# Patient Record
Sex: Female | Born: 1975 | Race: White | Hispanic: No | Marital: Married | Smoking: Never smoker
Health system: Southern US, Community
[De-identification: ages and names within clinical notes are randomized; demographics above are authoritative.]

## PROBLEM LIST (undated history)

## (undated) DIAGNOSIS — F419 Anxiety disorder, unspecified: Secondary | ICD-10-CM

## (undated) DIAGNOSIS — R87619 Unspecified abnormal cytological findings in specimens from cervix uteri: Secondary | ICD-10-CM

## (undated) DIAGNOSIS — IMO0002 Reserved for concepts with insufficient information to code with codable children: Secondary | ICD-10-CM

## (undated) DIAGNOSIS — Z8742 Personal history of other diseases of the female genital tract: Secondary | ICD-10-CM

## (undated) DIAGNOSIS — T7840XA Allergy, unspecified, initial encounter: Secondary | ICD-10-CM

## (undated) HISTORY — DX: Allergy, unspecified, initial encounter: T78.40XA

## (undated) HISTORY — PX: OTHER SURGICAL HISTORY: SHX169

## (undated) HISTORY — DX: Anxiety disorder, unspecified: F41.9

## (undated) HISTORY — PX: TONSILLECTOMY: SUR1361

## (undated) HISTORY — DX: Personal history of other diseases of the female genital tract: Z87.42

---

## 2004-02-07 ENCOUNTER — Other Ambulatory Visit: Admission: RE | Admit: 2004-02-07 | Discharge: 2004-02-07 | Payer: Self-pay | Admitting: Family Medicine

## 2005-02-08 ENCOUNTER — Other Ambulatory Visit: Admission: RE | Admit: 2005-02-08 | Discharge: 2005-02-08 | Payer: Self-pay | Admitting: Obstetrics and Gynecology

## 2005-03-19 ENCOUNTER — Ambulatory Visit: Payer: Self-pay | Admitting: Sports Medicine

## 2006-06-17 ENCOUNTER — Inpatient Hospital Stay (HOSPITAL_COMMUNITY): Admission: AD | Admit: 2006-06-17 | Discharge: 2006-06-19 | Payer: Self-pay | Admitting: Obstetrics and Gynecology

## 2007-02-25 ENCOUNTER — Ambulatory Visit: Payer: Self-pay | Admitting: Sports Medicine

## 2007-02-25 DIAGNOSIS — M629 Disorder of muscle, unspecified: Secondary | ICD-10-CM

## 2007-10-01 ENCOUNTER — Encounter: Admission: RE | Admit: 2007-10-01 | Discharge: 2007-11-17 | Payer: Self-pay | Admitting: Family Medicine

## 2009-01-09 ENCOUNTER — Ambulatory Visit: Payer: Self-pay | Admitting: Sports Medicine

## 2009-04-29 HISTORY — PX: LEEP: SHX91

## 2009-09-14 ENCOUNTER — Ambulatory Visit: Payer: Self-pay | Admitting: Sports Medicine

## 2009-09-14 DIAGNOSIS — M25569 Pain in unspecified knee: Secondary | ICD-10-CM | POA: Insufficient documentation

## 2009-11-09 ENCOUNTER — Ambulatory Visit: Payer: Self-pay | Admitting: Sports Medicine

## 2010-01-31 ENCOUNTER — Ambulatory Visit: Admission: RE | Admit: 2010-01-31 | Discharge: 2010-01-31 | Payer: Self-pay | Admitting: Gynecologic Oncology

## 2010-04-26 ENCOUNTER — Ambulatory Visit
Admission: RE | Admit: 2010-04-26 | Discharge: 2010-04-26 | Payer: Self-pay | Source: Home / Self Care | Attending: Gynecologic Oncology | Admitting: Gynecologic Oncology

## 2010-04-26 ENCOUNTER — Other Ambulatory Visit
Admission: RE | Admit: 2010-04-26 | Discharge: 2010-04-26 | Payer: Self-pay | Source: Home / Self Care | Admitting: Gynecologic Oncology

## 2010-04-29 NOTE — L&D Delivery Note (Signed)
Delivery Note At 12:38 AM a viable female was delivered via Vaginal, Spontaneous Delivery   APGAR: 8, 9; weight . Placenta status: Intact, Spontaneous.  Cord:  with the following complications: None.  Cord pH: not done  Anesthesia: Epidural  Episiotomy:  Lacerations: 2nd degree Suture Repair: 3.0 chromic Est. Blood Loss (mL):   Mom to postpartum.  Baby to nursery-stable.  Issak Goley L 02/03/2011, 12:51 AM

## 2010-05-29 NOTE — Assessment & Plan Note (Signed)
Summary: F/U,MC   Vital Signs:  Patient profile:   35 year old female BP sitting:   102 / 68  Vitals Entered By: Lillia Pauls CMA (November 09, 2009 9:56 AM)  History of Present Illness: pt here today to f/u her right knee pain which is about 75% better per pt.  she currently has no pain or swelling.  she still wears the bodyhelix knee sleeve that helps her quite a bit while she exercises.  she is running 11.5 miles per week and currently runs 4 x wk.  desire is to run in a 1/2 marathon in [place]. of this year.   Physical Exam  General:  Well-developed,well-nourished,in no acute distress; alert,appropriate and cooperative throughout examination Msk:  knee exam shows no effusion; stable ligaments; negative Mcmurray's and provocative meniscal tests; non painful patellar compression; patellar and quadriceps tendons unremarkable.  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

## 2010-05-29 NOTE — Assessment & Plan Note (Signed)
Summary: F/U KNEE PAIN,MC   Vital Signs:  Patient profile:   35 year old female BP sitting:   116 / 73  Vitals Entered By: Lillia Pauls CMA (Sep 14, 2009 9:44 AM)  History of Present Illness: Kathryn Bennett took most of winter off but did do elliptical started at 4 x wk 3 to 4 miles after a couple of weeks had RT knee pain [place] also pushes daugher in stroller on weekends  past episodes of runners knee swelled some this time different - very painful down stairs hurt at [employer] and hurt around MM   Physical Exam  General:  Well-developed,well-nourished,in no acute distress; alert,appropriate and cooperative throughout examination Msk:  RT knee exam shows no effusion; stable ligaments; negative Mcmurray's and provocative meniscal tests; non painful patellar compression; patellar and quadriceps tendons unremarkable.  does have some slt crepitation only on RT knee  LT knee is normal  Extremities:  running gait is excellent uses sports insoles   Impression & Recommendations:  Problem # 1:  KNEE PAIN, RIGHT (ICD-719.46) seems like classic runners knee  see rehab program w std exercises  Patient Instructions: 1)  do quad exercises daily 2)  avoid squats - ok runner's squat 3)  ice after activities 4)  ok to start w running about 2 miles and then build half mile per wk if OK 5)  reck in 8 wks 6)  use the knee sleeve unless painful 7)  give me an email report every 2 wks about sleeve if it seems to be working well 8)  rtc 2months

## 2010-06-22 LAB — HEPATITIS B SURFACE ANTIGEN: Hepatitis B Surface Ag: NEGATIVE

## 2010-06-22 LAB — HIV ANTIBODY (ROUTINE TESTING W REFLEX): HIV: NONREACTIVE

## 2010-06-22 LAB — ANTIBODY SCREEN: Antibody Screen: NEGATIVE

## 2010-06-22 LAB — ABO/RH

## 2010-09-14 NOTE — Op Note (Signed)
NAME:  Kathryn Bennett, Kathryn Bennett               ACCOUNT NO.:  [id number]   MEDICAL RECORD NO.:  [id number]          PATIENT TYPE:  INP   LOCATION:  9170                          FACILITY:  [facility]   PHYSICIAN:  Juluis Mire, M.D.   DATE OF BIRTH:  Apr 23, 1976   DATE OF PROCEDURE:  06/17/2006  DATE OF DISCHARGE:                               OPERATIVE REPORT   DELIVERY NOTE:  The patient progressed to complete dilatation.  With  pushing, the infant's heart rate demonstrated repetitive decels down  into the 70s and 80s with slow recovery.  The vertex was presenting at  the perineum at this point in time, was in the LOA presentation.  It was  noted that she did have meconium stained fluid prior, although it was  felt to be thin, no amnio-infusion was performed.  Due to repetitive  decels, the decision was to expedite delivery with the use of a vacuum  extractor.  The risks were discussed including the possible risk of  subcutaneous bleed versus intracranial bleeds.  The patient had a Foley  catheter in place that was removed.  She was placed in the dorsal  lithotomy position.  She was prepped and draped.  The Kiwi vacuum  extractor was put in place, the next contraction brought it to the green  level, and the vertex was brought down further to the perineum. The Kiwi  slipped at this point in time.  She underwent continued expulsive  efforts with further descent, but the fetal heart rates dropped in the  70s and persisted.  The Kiwi was reapplied, with the next contraction,  the vertex was delivered.  The oropharynx and nasopharynx were suctioned  with DeLee and bulb.  The infant was delivered and passed to the  awaiting pediatric team.  The infant was a live female, Apgars were 8/9,  pH is pending.  There was no evidence of any complications.  No nuchal  cord was noted.  The placenta was delivered intact with three vessel  cord.  There was no episiotomy, but she had a second degree midline  tear.  It  was repaired with 2-0 chromic under local.  Again, the  placenta was delivered intact.  Bleeding was minimal with a total blood  loss of 500 mL.  The patient did have an epidural in place.  Both mother  and baby did well in the postpartum period.      Juluis Mire, M.D.  Electronically Signed     JSM/MEDQ  D:  06/17/2006  T:  06/17/2006  Job:  [id number]

## 2010-10-24 ENCOUNTER — Encounter: Payer: Self-pay | Admitting: Family Medicine

## 2010-10-24 ENCOUNTER — Ambulatory Visit (INDEPENDENT_AMBULATORY_CARE_PROVIDER_SITE_OTHER): Payer: Self-pay | Admitting: Family Medicine

## 2010-10-24 VITALS — BP 106/63 | HR 87

## 2010-10-24 DIAGNOSIS — M949 Disorder of cartilage, unspecified: Secondary | ICD-10-CM

## 2010-10-24 DIAGNOSIS — M899 Disorder of bone, unspecified: Secondary | ICD-10-CM

## 2010-10-24 NOTE — Progress Notes (Signed)
  Subjective:    Patient ID: Kathryn Bennett, female    DOB: 06/16/1975, 35 y.o.   MRN: [id number]  HPI 35 year old female who is a G2 P1 and presently at 26 weeks of pregnancy here for evaluation of right shoulder blade pain. 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 She states for several years she's had intermittent medial right shoulder blade pain that is kind of a dull ache with occasional throbbing. For the last 5 days, the pain has gotten worse and is turned into more of a burning sensation. She states she's done physical therapy in the past, this included ultrasound and some deep massage. In the past, she has done pred burst and is unsure if it helped.  She has never really done significant rehabilitation exercises for this. She denies any lateral shoulder pain or radiculopathy into her hands. She denies any neck pain. She states the pregnancy is going well. She's not had any recent vaginal bleeding, abdominal pain, or significant urticaria. Avid running, running 12 miles per week.  Also does yoga 1 x/week, but hasn't recently because of time constraints. Really has not taken much in the way of medicines, and she would like to stay away from them during her pregnancy.   Review of Systems Denies fever, sweats, chills, weight loss, vaginal bleeding, urticaria, abdominal pain. She is tolerating by mouth normally.    Objective:   Physical Exam Gen. appearance: Well-appearing gravid female in no distress Neck: Supple, full range of motion, negative Spurling bilaterally Right shoulder: Full range of motion. Normal rotator cuff strength. Negative impingement signs. Upper back: Positive tenderness with some muscle spasm just medial to the right scapula medial border. Actually has fairly good scapular motion with no significant dyskinesis. No significant increase in pain with protraction or retraction or shoulder shrug. Neuro: Normal upper extremity strength. Sensation intact to light  touch.       Assessment & Plan:  Right scapular pain. Likely from long-term irritation and dysfunction of the middle and lower trapezius muscles as well as possibly the rhomboids. - Given she is pregnant, we'll do Tylenol as needed if the patient desires -Reviewed scapular dysfunction exercises with her. These are to include the robbery, lawnmower, the inferior glide, rows, reverse rows, and horizontal abduction exercises. - Can ice and or heat as desired - She will also try to get back into doing yoga to see if this will help as well - I did spent time explaining how pregnancy with increased weight in the belly and breast region can increase stress to scapular stabilizing muscles. -I think she can followup as needed, preferably after her pregnancy to see if this improves on its own. If it does not, or gets worse, or she has new or changing symptoms, at at some point could consider a trigger point injection versus an MRI of the neck to rule out C-spine pathology, however think this is fairly low probability.

## 2011-01-07 LAB — STREP B DNA PROBE: GBS: NEGATIVE

## 2011-02-02 ENCOUNTER — Encounter (HOSPITAL_COMMUNITY): Payer: Self-pay

## 2011-02-02 ENCOUNTER — Inpatient Hospital Stay (HOSPITAL_COMMUNITY): Payer: 59 | Admitting: Anesthesiology

## 2011-02-02 ENCOUNTER — Encounter (HOSPITAL_COMMUNITY): Payer: Self-pay | Admitting: Anesthesiology

## 2011-02-02 ENCOUNTER — Inpatient Hospital Stay (HOSPITAL_COMMUNITY)
Admission: AD | Admit: 2011-02-02 | Discharge: 2011-02-04 | DRG: 775 | Disposition: A | Discharge status: Home / Self Care | Payer: 59 | Source: Ambulatory Visit | Attending: Obstetrics and Gynecology | Admitting: Obstetrics and Gynecology

## 2011-02-02 DIAGNOSIS — M629 Disorder of muscle, unspecified: Secondary | ICD-10-CM

## 2011-02-02 DIAGNOSIS — M899 Disorder of bone, unspecified: Secondary | ICD-10-CM

## 2011-02-02 DIAGNOSIS — M25569 Pain in unspecified knee: Secondary | ICD-10-CM

## 2011-02-02 HISTORY — DX: Unspecified abnormal cytological findings in specimens from cervix uteri: R87.619

## 2011-02-02 HISTORY — DX: Reserved for concepts with insufficient information to code with codable children: IMO0002

## 2011-02-02 LAB — RPR: RPR Ser Ql: NONREACTIVE

## 2011-02-02 LAB — RUBELLA SCREEN: Rubella: 42.6 IU/mL — ABNORMAL HIGH

## 2011-02-02 LAB — CBC
MCH: 30.7 pg (ref 26.0–34.0)
MCV: 89.6 fL (ref 78.0–100.0)
Platelets: 201 10*3/uL (ref 150–400)
RDW: 14 % (ref 11.5–15.5)
WBC: 14.7 10*3/uL — ABNORMAL HIGH (ref 4.0–10.5)

## 2011-02-02 MED ORDER — ACETAMINOPHEN 325 MG PO TABS: 650.0000 mg | ORAL_TABLET | ORAL | Status: DC | PRN

## 2011-02-02 MED ORDER — LACTATED RINGERS IV SOLN: 500.0000 mL | INTRAVENOUS | Status: DC | PRN

## 2011-02-02 MED ORDER — CITRIC ACID-SODIUM CITRATE 334-500 MG/5ML PO SOLN
30.0000 mL | ORAL | Status: DC | PRN
  Administered 2011-02-02: 30 mL via ORAL
  Filled 2011-02-02: qty 15

## 2011-02-02 MED ORDER — OXYTOCIN 20 UNITS IN LACTATED RINGERS INFUSION - SIMPLE: 125.0000 mL/h | Freq: Once | INTRAVENOUS | Status: DC

## 2011-02-02 MED ORDER — LIDOCAINE HCL (PF) 1 % IJ SOLN
30.0000 mL | INTRAMUSCULAR | Status: DC | PRN
  Filled 2011-02-02 (×2): qty 30

## 2011-02-02 MED ORDER — LIDOCAINE HCL 1.5 % IJ SOLN
INTRAMUSCULAR | Status: DC | PRN
  Administered 2011-02-02 (×2): 5 mL via EPIDURAL

## 2011-02-02 MED ORDER — LACTATED RINGERS IV SOLN
INTRAVENOUS | Status: DC
  Administered 2011-02-02 (×2): via INTRAVENOUS

## 2011-02-02 MED ORDER — FLEET ENEMA 7-19 GM/118ML RE ENEM: 1.0000 | ENEMA | RECTAL | Status: DC | PRN

## 2011-02-02 MED ORDER — OXYCODONE-ACETAMINOPHEN 5-325 MG PO TABS: 2.0000 | ORAL_TABLET | ORAL | Status: DC | PRN

## 2011-02-02 MED ORDER — EPHEDRINE 5 MG/ML INJ
10.0000 mg | INTRAVENOUS | Status: AC | PRN
  Administered 2011-02-02 (×2): 10 mg via INTRAVENOUS
  Filled 2011-02-02: qty 4

## 2011-02-02 MED ORDER — TERBUTALINE SULFATE 1 MG/ML IJ SOLN: 0.2500 mg | Freq: Once | INTRAMUSCULAR | Status: AC | PRN

## 2011-02-02 MED ORDER — OXYTOCIN BOLUS FROM INFUSION
500.0000 mL | Freq: Once | INTRAVENOUS | Status: DC
  Filled 2011-02-02: qty 500

## 2011-02-02 MED ORDER — FENTANYL 2.5 MCG/ML BUPIVACAINE 1/10 % EPIDURAL INFUSION (WH - ANES)
INTRAMUSCULAR | Status: DC | PRN
  Administered 2011-02-02: 14 mL/h via EPIDURAL

## 2011-02-02 MED ORDER — IBUPROFEN 600 MG PO TABS
600.0000 mg | ORAL_TABLET | Freq: Four times a day (QID) | ORAL | Status: DC | PRN
  Filled 2011-02-02 (×4): qty 1

## 2011-02-02 MED ORDER — EPHEDRINE 5 MG/ML INJ
10.0000 mg | INTRAVENOUS | Status: DC | PRN
  Filled 2011-02-02: qty 4

## 2011-02-02 MED ORDER — DIPHENHYDRAMINE HCL 50 MG/ML IJ SOLN: 12.5000 mg | INTRAMUSCULAR | Status: DC | PRN

## 2011-02-02 MED ORDER — LACTATED RINGERS IV SOLN: 500.0000 mL | Freq: Once | INTRAVENOUS | Status: DC

## 2011-02-02 MED ORDER — ONDANSETRON HCL 4 MG/2ML IJ SOLN: 4.0000 mg | Freq: Four times a day (QID) | INTRAMUSCULAR | Status: DC | PRN

## 2011-02-02 MED ORDER — OXYTOCIN 20 UNITS IN LACTATED RINGERS INFUSION - SIMPLE
1.0000 m[IU]/min | INTRAVENOUS | Status: DC
  Administered 2011-02-02: 2 m[IU]/min via INTRAVENOUS
  Administered 2011-02-03: 999 m[IU]/min via INTRAVENOUS
  Filled 2011-02-02: qty 1000

## 2011-02-02 MED ORDER — PHENYLEPHRINE 40 MCG/ML (10ML) SYRINGE FOR IV PUSH (FOR BLOOD PRESSURE SUPPORT)
80.0000 ug | PREFILLED_SYRINGE | INTRAVENOUS | Status: DC | PRN
  Filled 2011-02-02 (×2): qty 5

## 2011-02-02 MED ORDER — FENTANYL 2.5 MCG/ML BUPIVACAINE 1/10 % EPIDURAL INFUSION (WH - ANES)
14.0000 mL/h | INTRAMUSCULAR | Status: DC
  Administered 2011-02-02: 14 mL/h via EPIDURAL
  Filled 2011-02-02 (×2): qty 60

## 2011-02-02 MED ORDER — PHENYLEPHRINE 40 MCG/ML (10ML) SYRINGE FOR IV PUSH (FOR BLOOD PRESSURE SUPPORT)
80.0000 ug | PREFILLED_SYRINGE | INTRAVENOUS | Status: DC | PRN
  Filled 2011-02-02: qty 5

## 2011-02-02 NOTE — H&P (Signed)
35 year ol G 2 P 1001 at 40 w 2 d presents in active labor. BOW I. PNC See [facility]. History of LEEP in 11/2009. History of NSVD x 1 GBBS is negative  Afebrile VSS FHR is reactive  Toco UCS every 4 to 5 min Gen alert and oriented Lung CTAB Car RRR Abd gravid Leopolds 8 pound Cervix per L and D nurse 4 cm dilated / vertex   IMP: IUP at 40 w 2 day Labor  PLAN: Epidural  Then AROM

## 2011-02-02 NOTE — Progress Notes (Signed)
Patient is status post epidural FHR is reassurring Toco Ucs every 2 to 4  Cervix 90/7 / - 2  OP position IUPC placed  No change since exam at 630 pm  IUP at term  Given slow progress of labor We will monitor Montivideo Units now and adjust pitocin accordingly.  Discussed with patient and her husband that if progress does not occur we may need to discuss Cesarean Section All Questions are answered.

## 2011-02-02 NOTE — Anesthesia Preprocedure Evaluation (Signed)
Anesthesia Evaluation  Name, MR# and DOB Patient awake  General Assessment Comment  Reviewed: Allergy & Precautions, H&P , Patient's Chart, lab work & pertinent test results  Airway Mallampati: I TM Distance: >3 FB Neck ROM: full    Dental No notable dental hx.    Pulmonary    Pulmonary exam normal       Cardiovascular     Neuro/Psych Negative Neurological ROS  Negative Psych ROS   GI/Hepatic negative GI ROS Neg liver ROS    Endo/Other  Negative Endocrine ROS  Renal/GU negative Renal ROS     Musculoskeletal negative musculoskeletal ROS (+)   Abdominal Normal abdominal exam  (+)   Peds  Hematology negative hematology ROS (+)   Anesthesia Other Findings   Reproductive/Obstetrics (+) Pregnancy                           Anesthesia Physical Anesthesia Plan  ASA: II  Anesthesia Plan: Epidural   Post-op Pain Management:    Induction:   Airway Management Planned:   Additional Equipment:   Intra-op Plan:   Post-operative Plan:   Informed Consent: I have reviewed the patients History and Physical, chart, labs and discussed the procedure including the risks, benefits and alternatives for the proposed anesthesia with the patient or authorized representative who has indicated his/her understanding and acceptance.     Plan Discussed with:   Anesthesia Plan Comments:         Anesthesia Quick Evaluation

## 2011-02-02 NOTE — Anesthesia Procedure Notes (Signed)
Epidural Patient location during procedure: OB Start time: 02/02/2011 6:11 PM End time: 02/02/2011 6:15 PM  Staffing Anesthesiologist: Sandrea Hughs Performed by: anesthesiologist   Preanesthetic Checklist Completed: patient identified, site marked, surgical consent, pre-op evaluation, timeout performed, IV checked, risks and benefits discussed and monitors and equipment checked  Epidural Patient position: sitting Prep: site prepped and draped and DuraPrep Patient monitoring: continuous pulse ox and blood pressure Approach: midline Injection technique: LOR air  Needle:  Needle type: Tuohy  Needle gauge: 17 G Needle length: 9 cm Needle insertion depth: 5 cm cm Catheter type: closed end flexible Catheter size: 19 Gauge Catheter at skin depth: 10 cm Test dose: negative and 1.5% lidocaine  Assessment Sensory level: T8 Events: blood not aspirated, injection not painful, no injection resistance, negative IV test and no paresthesia

## 2011-02-02 NOTE — Progress Notes (Signed)
Onset of contractions since 0800 this morning every 8 minutes, bleeding on tissue when wipe, pressure.

## 2011-02-03 ENCOUNTER — Encounter (HOSPITAL_COMMUNITY): Payer: Self-pay | Admitting: *Deleted

## 2011-02-03 LAB — CBC
MCH: 30.3 pg (ref 26.0–34.0)
Platelets: 187 10*3/uL (ref 150–400)
RBC: 3.8 MIL/uL — ABNORMAL LOW (ref 3.87–5.11)
WBC: 20 10*3/uL — ABNORMAL HIGH (ref 4.0–10.5)

## 2011-02-03 MED ORDER — DIBUCAINE 1 % RE OINT: 1.0000 "application " | TOPICAL_OINTMENT | RECTAL | Status: DC | PRN

## 2011-02-03 MED ORDER — FAMOTIDINE 20 MG PO TABS
20.0000 mg | ORAL_TABLET | Freq: Every day | ORAL | Status: DC
  Administered 2011-02-03 – 2011-02-04 (×2): 20 mg via ORAL
  Filled 2011-02-03 (×2): qty 1

## 2011-02-03 MED ORDER — WITCH HAZEL-GLYCERIN EX PADS
1.0000 "application " | MEDICATED_PAD | CUTANEOUS | Status: DC | PRN
  Administered 2011-02-03: 1 via TOPICAL

## 2011-02-03 MED ORDER — BISACODYL 10 MG RE SUPP: 10.0000 mg | Freq: Every day | RECTAL | Status: DC | PRN

## 2011-02-03 MED ORDER — SIMETHICONE 80 MG PO CHEW: 80.0000 mg | CHEWABLE_TABLET | ORAL | Status: DC | PRN

## 2011-02-03 MED ORDER — PRENATAL PLUS 27-1 MG PO TABS
1.0000 | ORAL_TABLET | Freq: Every day | ORAL | Status: DC
  Administered 2011-02-03 – 2011-02-04 (×2): 1 via ORAL
  Filled 2011-02-03 (×2): qty 1

## 2011-02-03 MED ORDER — BENZOCAINE-MENTHOL 20-0.5 % EX AERO
INHALATION_SPRAY | CUTANEOUS | Status: AC
  Administered 2011-02-03: 1 via TOPICAL
  Filled 2011-02-03: qty 56

## 2011-02-03 MED ORDER — DIPHENHYDRAMINE HCL 25 MG PO CAPS: 25.0000 mg | ORAL_CAPSULE | Freq: Four times a day (QID) | ORAL | Status: DC | PRN

## 2011-02-03 MED ORDER — TETANUS-DIPHTH-ACELL PERTUSSIS 5-2.5-18.5 LF-MCG/0.5 IM SUSP: 0.5000 mL | Freq: Once | INTRAMUSCULAR | Status: DC

## 2011-02-03 MED ORDER — SENNOSIDES-DOCUSATE SODIUM 8.6-50 MG PO TABS
2.0000 | ORAL_TABLET | Freq: Every day | ORAL | Status: DC
  Administered 2011-02-03: 2 via ORAL

## 2011-02-03 MED ORDER — MEASLES, MUMPS & RUBELLA VAC ~~LOC~~ INJ: 0.5000 mL | INJECTION | Freq: Once | SUBCUTANEOUS | Status: DC

## 2011-02-03 MED ORDER — ONDANSETRON HCL 4 MG/2ML IJ SOLN: 4.0000 mg | INTRAMUSCULAR | Status: DC | PRN

## 2011-02-03 MED ORDER — IBUPROFEN 600 MG PO TABS
600.0000 mg | ORAL_TABLET | Freq: Four times a day (QID) | ORAL | Status: DC
  Administered 2011-02-03 – 2011-02-04 (×6): 600 mg via ORAL
  Filled 2011-02-03 (×2): qty 1

## 2011-02-03 MED ORDER — OXYCODONE-ACETAMINOPHEN 5-325 MG PO TABS: 1.0000 | ORAL_TABLET | ORAL | Status: DC | PRN

## 2011-02-03 MED ORDER — MEDROXYPROGESTERONE ACETATE 150 MG/ML IM SUSP: 150.0000 mg | INTRAMUSCULAR | Status: DC | PRN

## 2011-02-03 MED ORDER — ONDANSETRON HCL 4 MG PO TABS: 4.0000 mg | ORAL_TABLET | ORAL | Status: DC | PRN

## 2011-02-03 MED ORDER — ZOLPIDEM TARTRATE 5 MG PO TABS: 5.0000 mg | ORAL_TABLET | Freq: Every evening | ORAL | Status: DC | PRN

## 2011-02-03 MED ORDER — FLEET ENEMA 7-19 GM/118ML RE ENEM: 1.0000 | ENEMA | RECTAL | Status: DC | PRN

## 2011-02-03 MED ORDER — BENZOCAINE-MENTHOL 20-0.5 % EX AERO
1.0000 "application " | INHALATION_SPRAY | CUTANEOUS | Status: DC | PRN
  Administered 2011-02-03: 1 via TOPICAL

## 2011-02-03 MED ORDER — LANOLIN HYDROUS EX OINT: TOPICAL_OINTMENT | CUTANEOUS | Status: DC | PRN

## 2011-02-03 NOTE — Anesthesia Postprocedure Evaluation (Signed)
  Anesthesia Post-op Note  Patient: Kathryn Bennett  Procedure(s) Performed: * No procedures listed *  Patient Location: Mother/Baby  Anesthesia Type: Epidural  Level of Consciousness: awake, alert  and oriented  Airway and Oxygen Therapy: Patient Spontanous Breathing  Post-op Pain: none  Post-op Assessment: Patient's Cardiovascular Status Stable, Respiratory Function Stable, Patent Airway, No signs of Nausea or vomiting, Adequate PO intake and Pain level controlled  Post-op Vital Signs: stable  Complications: No apparent anesthesia complications

## 2011-02-03 NOTE — Progress Notes (Signed)
Post Partum Day 0 Subjective: no complaints, up ad lib, voiding and tolerating PO  Objective: Blood pressure 91/53, pulse 75, temperature 98.4 F (36.9 C), temperature source Axillary, resp. rate 18, height 5\' 3"  (1.6 m), weight 80.015 kg (176 lb 6.4 oz), SpO2 99.00%, unknown if currently breastfeeding.  Physical Exam:  General: alert Lochia: appropriate Uterine Fundus: firm Incision: not applicable  DVT Evaluation: No evidence of DVT seen on physical exam.   Basename 02/03/11 0510 02/02/11 1700  HGB 11.5* 13.6  HCT 34.4* 39.7    Assessment/Plan: Discharge home and Breastfeeding   LOS: 1 day   Kathryn Bennett 02/03/2011, 7:48 AM

## 2011-02-03 NOTE — Anesthesia Postprocedure Evaluation (Signed)
Anesthesia Post Note  Patient: Kathryn Bennett  Procedure(s) Performed: * No procedures listed *  Anesthesia type: Epidural  Patient location: Mother/Baby  Post pain: Pain level controlled  Post assessment: Post-op Vital signs reviewed  Last Vitals:  Filed Vitals:   02/03/11 0345  BP: 111/64  Pulse: 110  Temp: 98.4 F (36.9 C)  Resp: 18    Post vital signs: Reviewed  Level of consciousness: awake  Complications: No apparent anesthesia complications

## 2011-02-04 MED ORDER — IBUPROFEN 600 MG PO TABS: 600.0000 mg | ORAL_TABLET | Freq: Four times a day (QID) | ORAL | Status: AC | PRN

## 2011-02-04 NOTE — Progress Notes (Signed)
Post Partum Day 1 Subjective: no complaints, up ad lib, voiding and desires early discharge  Objective: Blood pressure 98/60, pulse 65, temperature 97.5 F (36.4 C), temperature source Axillary, resp. rate 18, height 5\' 3"  (1.6 m), weight 80.015 kg (176 lb 6.4 oz), SpO2 99.00%, unknown if currently breastfeeding.  Physical Exam:  General: alert and cooperative Lochia: appropriate Uterine Fundus: firm Perineum intact DVT Evaluation: No evidence of DVT seen on physical exam.   Basename 02/03/11 0510 02/02/11 1700  HGB 11.5* 13.6  HCT 34.4* 39.7    Assessment/Plan: Discharge home   LOS: 2 days   Kathryn Bennett 02/04/2011, 8:42 AM

## 2011-02-04 NOTE — Discharge Summary (Signed)
Obstetric Discharge Summary Reason for Admission: onset of labor Prenatal Procedures: none Intrapartum Procedures: spontaneous vaginal delivery Postpartum Procedures: none Complications-Operative and Postpartum: 2 degree perineal laceration Hemoglobin  Date Value Range Status  02/03/2011 11.5* 12.0-15.0 (g/dL) Final     HCT  Date Value Range Status  02/03/2011 34.4* 36.0-46.0 (%) Final    Discharge Diagnoses: Term Pregnancy-delivered  Discharge Information: Date: 02/04/2011 Activity: pelvic rest Diet: routine Medications: None and Ibuprofen Condition: stable Instructions: refer to practice specific booklet Discharge to: home   Newborn Data: Live born female  Birth Weight: 9 lb 11.6 oz (4410 g) APGAR: 8, 9  Home with mother.  Kathryn Bennett G 02/04/2011, 8:51 AM

## 2011-02-07 ENCOUNTER — Inpatient Hospital Stay (HOSPITAL_COMMUNITY): Admission: RE | Admit: 2011-02-07 | Payer: 59 | Source: Ambulatory Visit

## 2011-02-27 ENCOUNTER — Ambulatory Visit (INDEPENDENT_AMBULATORY_CARE_PROVIDER_SITE_OTHER): Payer: 59 | Admitting: Family Medicine

## 2011-02-27 VITALS — BP 96/70 | Ht 63.0 in | Wt 152.0 lb

## 2011-02-27 DIAGNOSIS — M79673 Pain in unspecified foot: Secondary | ICD-10-CM

## 2011-02-27 DIAGNOSIS — M533 Sacrococcygeal disorders, not elsewhere classified: Secondary | ICD-10-CM

## 2011-02-27 DIAGNOSIS — M999 Biomechanical lesion, unspecified: Secondary | ICD-10-CM

## 2011-02-27 DIAGNOSIS — M25569 Pain in unspecified knee: Secondary | ICD-10-CM

## 2011-02-27 DIAGNOSIS — M79609 Pain in unspecified limb: Secondary | ICD-10-CM

## 2011-02-27 DIAGNOSIS — M25561 Pain in right knee: Secondary | ICD-10-CM

## 2011-02-27 NOTE — Patient Instructions (Signed)
1. Follow up with me in one month.  2. Physical therapy should call you for your appointment.  The phone number is [phone] in case you don't get a call.  3. Wear your insoles in all of your shoes.

## 2011-03-01 DIAGNOSIS — M79673 Pain in unspecified foot: Secondary | ICD-10-CM | POA: Insufficient documentation

## 2011-03-01 DIAGNOSIS — M533 Sacrococcygeal disorders, not elsewhere classified: Secondary | ICD-10-CM | POA: Insufficient documentation

## 2011-03-01 DIAGNOSIS — M25562 Pain in left knee: Secondary | ICD-10-CM | POA: Insufficient documentation

## 2011-03-01 NOTE — Assessment & Plan Note (Signed)
Plan on 2 visits of formal physical therapy to work on quad and abductor strength.  After she learns the exercises she will advance to a home program.

## 2011-03-01 NOTE — Assessment & Plan Note (Signed)
She is 3 weeks s/p her delivery.  Expect that this condition would resolve spontaneously.  Since the patient is very uncomfortable we will send her for formal physical therapy x2 visits with advance to home program.

## 2011-03-01 NOTE — Progress Notes (Signed)
  Subjective:    Patient ID: Kathryn Bennett, female    DOB: 09/26/1975, 35 y.o.   MRN: [id number]  HPI 35 y/o female is c/o right sided lower back pain, bilateral forefoot numbness, and bilateral knee pain which started during her last trimester.  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  Regarding her feet, she did have swelling but that has improved.  She has no pain and there is no trauma.  Her back pain is mostly noticed when she tries to lie down.  There is no radiation of the pain down the buttocks or legs.  She has no numbness or weakness of the legs.   Review of Systems     Objective:   Physical Exam Back: Flexes and extends without an increase in her pain Tender to palpation over the right SI, otherwise non-tender FABER reproduces the pain Negative straight leg DTR's 2+ Great toe strength 5/5  Knees: Minimal crepitus Tender to palpation on the lateral patella bilat Positive patellar grind bilat No effusions ROM 0-135 No laxity No joint line tenderness quad and hip abductor weakness bilat  Feet: Collapse of transverse arches bilat Callus in the mid forefoot bilat Longitudinal arch preserved Sensation is normal except at the callus where it is decreased      Assessment & Plan:

## 2011-03-01 NOTE — Assessment & Plan Note (Signed)
She was fitted with sports insoles with metatarsal pads which should decreased the pressure over the heads of the metatarsals.

## 2011-03-19 ENCOUNTER — Ambulatory Visit: Payer: 59 | Attending: Family Medicine | Admitting: Physical Therapy

## 2011-03-19 DIAGNOSIS — IMO0001 Reserved for inherently not codable concepts without codable children: Secondary | ICD-10-CM | POA: Insufficient documentation

## 2011-03-19 DIAGNOSIS — M25559 Pain in unspecified hip: Secondary | ICD-10-CM | POA: Insufficient documentation

## 2011-03-19 DIAGNOSIS — M25659 Stiffness of unspecified hip, not elsewhere classified: Secondary | ICD-10-CM | POA: Insufficient documentation

## 2011-03-28 ENCOUNTER — Ambulatory Visit: Payer: 59 | Admitting: Physical Therapy

## 2011-04-04 ENCOUNTER — Encounter: Payer: 59 | Admitting: Physical Therapy

## 2011-04-11 ENCOUNTER — Ambulatory Visit: Payer: 59 | Attending: Family Medicine | Admitting: Physical Therapy

## 2011-04-11 DIAGNOSIS — IMO0001 Reserved for inherently not codable concepts without codable children: Secondary | ICD-10-CM | POA: Insufficient documentation

## 2011-04-11 DIAGNOSIS — M25659 Stiffness of unspecified hip, not elsewhere classified: Secondary | ICD-10-CM | POA: Insufficient documentation

## 2011-04-11 DIAGNOSIS — M25559 Pain in unspecified hip: Secondary | ICD-10-CM | POA: Insufficient documentation

## 2011-04-18 ENCOUNTER — Ambulatory Visit: Payer: 59 | Admitting: Physical Therapy

## 2011-04-25 ENCOUNTER — Ambulatory Visit: Payer: 59 | Admitting: Physical Therapy

## 2011-04-29 ENCOUNTER — Encounter: Payer: 59 | Admitting: Physical Therapy

## 2012-06-13 ENCOUNTER — Other Ambulatory Visit: Payer: Self-pay

## 2012-11-04 ENCOUNTER — Other Ambulatory Visit: Payer: Self-pay | Admitting: Obstetrics and Gynecology

## 2013-03-04 ENCOUNTER — Other Ambulatory Visit: Payer: Self-pay

## 2013-03-18 ENCOUNTER — Encounter (HOSPITAL_COMMUNITY): Payer: Self-pay | Admitting: Emergency Medicine

## 2013-03-18 ENCOUNTER — Emergency Department (INDEPENDENT_AMBULATORY_CARE_PROVIDER_SITE_OTHER)
Admission: EM | Admit: 2013-03-18 | Discharge: 2013-03-18 | Disposition: A | Discharge status: Home / Self Care | Payer: 59 | Source: Home / Self Care | Attending: Family Medicine | Admitting: Family Medicine

## 2013-03-18 DIAGNOSIS — R059 Cough, unspecified: Secondary | ICD-10-CM

## 2013-03-18 DIAGNOSIS — R0982 Postnasal drip: Secondary | ICD-10-CM

## 2013-03-18 DIAGNOSIS — R05 Cough: Secondary | ICD-10-CM

## 2013-03-18 MED ORDER — GUAIFENESIN-CODEINE 100-10 MG/5ML PO SYRP: 5.0000 mL | ORAL_SOLUTION | ORAL | Status: DC | PRN

## 2013-03-18 MED ORDER — PHENYLEPHRINE-CHLORPHEN-DM 10-4-12.5 MG/5ML PO LIQD: ORAL | Status: DC

## 2013-03-18 NOTE — ED Provider Notes (Signed)
CSN: [id number]     Arrival date & time 03/18/13  1217 History   First MD Initiated Contact with Patient 03/18/13 1307     No chief complaint on file.  (Consider location/radiation/quality/duration/timing/severity/associated sxs/prior Treatment) HPI Comments: C/O cough and PND that keeps her awake at night. Tried sudafed, netty pot, but no antihistamines or cough meds.   Past Medical History  Diagnosis Date  . Abnormal Pap smear    Past Surgical History  Procedure Laterality Date  . Tonsillectomy    . Leep  2011  . Extraction of wisdom teeth      age 37   No family history on file. History  Substance Use Topics  . Smoking status: Never Smoker   . Smokeless tobacco: Never Used  . Alcohol Use: No   OB History   Grav Para Term Preterm Abortions TAB SAB Ect Mult Living   2 2 2       2      Review of Systems  Constitutional: Negative for fever, chills, activity change, appetite change and fatigue.  HENT: Positive for postnasal drip and rhinorrhea. Negative for congestion, facial swelling and sore throat.   Eyes: Negative.   Respiratory: Positive for cough. Negative for shortness of breath, wheezing and stridor.   Cardiovascular: Negative.   Gastrointestinal: Negative.   Genitourinary: Negative.   Musculoskeletal: Negative for neck pain and neck stiffness.  Skin: Negative for pallor and rash.  Neurological: Negative.     Allergies  Amoxicillin  Home Medications   Current Outpatient Rx  Name  Route  Sig  Dispense  Refill  . docusate sodium (COLACE) 100 MG capsule   Oral   Take 100 mg by mouth 3 (three) times daily.           . fish oil-omega-3 fatty acids 1000 MG capsule   Oral   Take 2 g by mouth daily.           Marland Kitchen guaiFENesin-codeine (ROBITUSSIN AC) 100-10 MG/5ML syrup   Oral   Take 5 mLs by mouth every 4 (four) hours as needed for cough.   120 mL   0   . Phenylephrine-Chlorphen-DM 01-31-11.5 MG/5ML LIQD      5 ml q 4h during the night prn severe  cough and drainage.   120 mL   0   . prenatal vitamin w/FE, FA (PRENATAL 1 + 1) 27-1 MG TABS   Oral   Take 1 tablet by mouth daily.            BP 137/79  Pulse 96  Temp(Src) 98.7 F (37.1 C) (Oral)  Resp 20  SpO2 99% Physical Exam  Nursing note and vitals reviewed. Constitutional: She is oriented to person, place, and time. She appears well-developed and well-nourished. No distress.  HENT:  Right Ear: External ear normal.  Left Ear: External ear normal.  Mouth/Throat: No oropharyngeal exudate.  Minor injection. No erythema, swelling or exudates. Clear PND  Eyes: Conjunctivae and EOM are normal.  Neck: Normal range of motion. Neck supple.  Cardiovascular: Normal rate, regular rhythm and normal heart sounds.   Pulmonary/Chest: Effort normal and breath sounds normal. No respiratory distress. She has no wheezes. She has no rales.  Musculoskeletal: Normal range of motion. She exhibits no edema.  Lymphadenopathy:    She has no cervical adenopathy.  Neurological: She is alert and oriented to person, place, and time.  Skin: Skin is warm and dry. No rash noted.  Psychiatric: She has a normal mood  and affect.    ED Course  Procedures (including critical care time) Labs Review Labs Reviewed - No data to display Imaging Review No results found.    MDM   1. Cough   2. PND (post-nasal drip)      Norel CS as directed at night prn cough ad drainage. Cheratussin with Cod q 4h prn other times Allegra 180 mg q d for drainage. Plenty of fluids.  Hayden Rasmussen, NP 03/18/13 1418

## 2013-03-18 NOTE — ED Notes (Signed)
Pt called in all waiting areas and no response.

## 2013-03-18 NOTE — ED Notes (Signed)
Cough onset one week ago, seen by Hayden Rasmussen, np

## 2013-03-20 NOTE — ED Provider Notes (Signed)
Medical screening examination/treatment/procedure(s) were performed by a resident physician or non-physician practitioner and as the supervising physician I was immediately available for consultation/collaboration.  Geoffrey Hynes, MD    Cherese Lozano S Cherrell Maybee, MD 03/20/13 0839 

## 2014-01-18 ENCOUNTER — Other Ambulatory Visit: Payer: Self-pay | Admitting: Obstetrics and Gynecology

## 2014-01-19 LAB — CYTOLOGY - PAP

## 2014-02-28 ENCOUNTER — Encounter (HOSPITAL_COMMUNITY): Payer: Self-pay | Admitting: Emergency Medicine

## 2015-06-28 MED FILL — BUPROPION HCL XL 300 MG TAB: 300 | 90 days supply | Qty: 90 | Fill #1

## 2015-06-28 MED FILL — PANOXYL-4 ACNE CREAMY WASH: 4 | 30 days supply | Qty: 170 | Fill #1

## 2015-08-17 DIAGNOSIS — Z0100 Encounter for examination of eyes and vision without abnormal findings: Secondary | ICD-10-CM | POA: Diagnosis not present

## 2015-10-09 MED FILL — BUPROPION HCL XL 300 MG TAB: 300 | 90 days supply | Qty: 90 | Fill #2

## 2016-01-25 MED FILL — BUPROPION HCL XL 300 MG TAB: 300 | 90 days supply | Qty: 90 | Fill #3

## 2016-02-29 DIAGNOSIS — E559 Vitamin D deficiency, unspecified: Secondary | ICD-10-CM | POA: Diagnosis not present

## 2016-02-29 DIAGNOSIS — F411 Generalized anxiety disorder: Secondary | ICD-10-CM | POA: Diagnosis not present

## 2016-02-29 DIAGNOSIS — E282 Polycystic ovarian syndrome: Secondary | ICD-10-CM | POA: Diagnosis not present

## 2016-02-29 DIAGNOSIS — M25511 Pain in right shoulder: Secondary | ICD-10-CM | POA: Diagnosis not present

## 2016-02-29 DIAGNOSIS — Z0000 Encounter for general adult medical examination without abnormal findings: Secondary | ICD-10-CM | POA: Diagnosis not present

## 2016-03-04 MED FILL — VIT D2 1.25 MG (50,000 UNIT: 1.25 MG | 84 days supply | Qty: 12 | Fill #0

## 2016-03-19 DIAGNOSIS — Z01419 Encounter for gynecological examination (general) (routine) without abnormal findings: Secondary | ICD-10-CM | POA: Diagnosis not present

## 2016-03-19 DIAGNOSIS — Z6827 Body mass index (BMI) 27.0-27.9, adult: Secondary | ICD-10-CM | POA: Diagnosis not present

## 2016-05-01 MED FILL — BUPROPION HCL XL 300 MG TAB: 300 | 90 days supply | Qty: 90 | Fill #0

## 2016-08-05 MED FILL — BUPROPION HCL XL 300 MG TAB: 300 | 90 days supply | Qty: 90 | Fill #1

## 2016-09-11 ENCOUNTER — Encounter: Payer: Self-pay | Admitting: Student

## 2016-09-11 ENCOUNTER — Ambulatory Visit (INDEPENDENT_AMBULATORY_CARE_PROVIDER_SITE_OTHER): Payer: 59 | Admitting: Student

## 2016-09-11 DIAGNOSIS — M25551 Pain in right hip: Secondary | ICD-10-CM | POA: Insufficient documentation

## 2016-09-11 DIAGNOSIS — G8929 Other chronic pain: Secondary | ICD-10-CM

## 2016-09-11 DIAGNOSIS — M545 Low back pain, unspecified: Secondary | ICD-10-CM | POA: Insufficient documentation

## 2016-09-11 NOTE — Progress Notes (Signed)
Kathryn Bennett - 41 y.o. female MRN [id number]  Date of birth: 1975-06-28  SUBJECTIVE:  Including CC & ROS.  CC: right hip pain  Presents with right hip pain(that started really bothering her about a month ago. She reports that she had on-and-off pain since November. She has been trying to train for a marathon that she has in October. She has not really been training as her hip is bothering her. She stopped running about a week and her hip felt good but she can still felt pain in the hip. She cannot exactly localize where it hurts above planes anteriorly, laterally, and posteriorly for her where the pain can occur. She is not in any significant pain right now. She does have back pain at times. She describes her pain as a burning sensation radiates to the posterior hip. She denies any pain down the leg denies any numbness or tingling. Denies any bowel or bladder incontinence or weight loss.   ROS: No unexpected weight loss, fever, chills, swelling, instability, muscle pain, numbness/tingling, redness, otherwise see HPI   PMHx - Updated and reviewed.  Contributory factors include: Negative PSHx - Updated and reviewed.  Contributory factors include:  Negative FHx - Updated and reviewed.  Contributory factors include:  Negative Social Hx - Updated and reviewed. Contributory factors include: Negative Medications - reviewed   DATA REVIEWED: Previous office visits  PHYSICAL EXAM:  VS: BP:127/64  HR: bpm  TEMP: ( )  RESP:   HT:5\' 3"  (160 cm)   WT:150 lb (68 kg)  BMI:26.6 PHYSICAL EXAM: Gen: NAD, alert, cooperative with exam, well-appearing HEENT: clear conjunctiva,  CV:  no edema, capillary refill brisk, normal rate Resp: non-labored Skin: no rashes, normal turgor  Neuro: no gross deficits.  Psych:  alert and oriented  Hip: ROM IR: 45 Deg, ER: 45 Deg, Flexion: 70 Deg on right, 90 deg on left, Extension: 100 Deg, Abduction: 45 Deg, Adduction: 45 Deg Strength IR: 5/5, ER: 5/5, Flexion:  4+/5 on right, full on left, Extension: 5/5, Abduction: 5/5, Adduction: 5/5 Pelvic alignment unremarkable to inspection and palpation. Standing hip rotation and gait without trendelenburg sign / unsteadiness. Greater trochanter without tenderness to palpation. mild tenderness over piriformis on right. No pain with FABER or FADIR. No SI joint tenderness and normal minimal SI movement.  Back Exam:  Inspection: Unremarkable  Palpable tenderness: None. Range of Motion:  Flexion 45 deg; Extension 45 deg; Side Bending to 45 deg bilaterally; Rotation to 45 deg bilaterally  Leg strength: Quad: 5/5 Hamstring: 5/5 Hip flexor: 4+/5 on right, left 5/5, Hip abductors: 5/5  Strength at foot: Plantar-flexion: 5/5 Dorsi-flexion: 5/5 Eversion: 5/5 Inversion: 5/5  Sensory change: Gross sensation intact to all lumbar and sacral dermatomes.  Reflexes: 2+ at both patellar tendons, 2+ at achilles tendons, Babinski's downgoing.  Gait unremarkable. SLR laying: Negative  XSLR laying: Negative  FABER: negative.     ASSESSMENT & PLAN:   Right hip pain Believe this to be swelling from muscle imbalance at the right hip. She has markedly decreased flexibility of her hamstring on her right. She has mild decreased strength of hip flexion.  Does not appear to be intra-articular.  Does not appear to be her SI joints as well.  Believe this to either be the muscle imbalance or coming from her back.  She has no red flag signs. Her hip flexor strength was only mildly weekend.  Gave a home exercise program for core strengthening and hip  strengthening.  if she  would like to do formal physical therapy she can call and we can get an order for that  If this is not better in 6 weeks, would recommend looking into her back. If she calls asking for x-rays after 6 weeks, would get it two-view AP and lateral lumbar spine views. She will need follow-up after her x-rays, if she needs x-rays.

## 2016-09-11 NOTE — Assessment & Plan Note (Signed)
Believe this to be swelling from muscle imbalance at the right hip. She has markedly decreased flexibility of her hamstring on her right. She has mild decreased strength of hip flexion.  Does not appear to be intra-articular.  Does not appear to be her SI joints as well.  Believe this to either be the muscle imbalance or coming from her back.  She has no red flag signs. Her hip flexor strength was only mildly weekend.  Gave a home exercise program for core strengthening and hip  strengthening.  if she would like to do formal physical therapy she can call and we can get an order for that  If this is not better in 6 weeks, would recommend looking into her back. If she calls asking for x-rays after 6 weeks, would get it two-view AP and lateral lumbar spine views. She will need follow-up after her x-rays, if she needs x-rays.

## 2016-11-12 ENCOUNTER — Telehealth: Payer: Self-pay

## 2016-11-12 DIAGNOSIS — M25551 Pain in right hip: Secondary | ICD-10-CM

## 2016-11-12 NOTE — Telephone Encounter (Signed)
Order placed for physical therapy.  

## 2016-11-14 MED FILL — BUPROPION HCL XL 300 MG TAB: 300 | 90 days supply | Qty: 90 | Fill #2

## 2016-11-15 ENCOUNTER — Ambulatory Visit: Payer: 59 | Attending: Sports Medicine | Admitting: Rehabilitation

## 2016-11-15 ENCOUNTER — Encounter: Payer: Self-pay | Admitting: Rehabilitation

## 2016-11-15 DIAGNOSIS — R29898 Other symptoms and signs involving the musculoskeletal system: Secondary | ICD-10-CM | POA: Insufficient documentation

## 2016-11-15 DIAGNOSIS — M25551 Pain in right hip: Secondary | ICD-10-CM | POA: Insufficient documentation

## 2016-11-15 NOTE — Therapy (Signed)
[facility] [address] [place], [place], [address] Phone: [phone]   Fax:  [phone]  Physical Therapy Evaluation  Patient Details  Name: Kathryn Bennett MRN: [id number] Date of Birth: 07-02-1975 Referring Provider: Cardell Peach, DO referring but Dr. Darrick Penna now as she has left  Encounter Date: 11/15/2016      PT End of Session - 11/15/16 1205    Visit Number 1   Number of Visits 6   Date for PT Re-Evaluation 12/27/16   PT Start Time 0920   PT Stop Time 1010   PT Time Calculation (min) 50 min   Activity Tolerance Patient tolerated treatment well      Past Medical History:  Diagnosis Date  . Abnormal Pap smear     Past Surgical History:  Procedure Laterality Date  . extraction of wisdom teeth     age 32  . LEEP  2011  . TONSILLECTOMY      There were no vitals filed for this visit.       Subjective Assessment - 11/15/16 0922    Subjective Pt presents with R hip pain (piriformis region) when training for a marathon.  Has run a few half marathons in the past without pain.  Starting running 3-4 miles per session and then started having pain in the R glute with even lifting the leg.  The MD told her to start stretching the hamstrings and strengthen the glutes which helps but she reports less of a stretch in the hamstring and more of a pain in the glute region.  The pain has started to be more constant.  Denies nt or leg pain.  Sitting is the worst and starting to hurt when driving daughter 15 minutes.  Reports no shoes changes lately.  Currently doing glute and hamstring stretches but reports it never quite "gets the spot".  Has had a massage which focused on the quads and glutes.     Limitations Sitting   How long can you sit comfortably? 15 minutes   How long can you stand comfortably? pain upon sit to stand   How long can you walk comfortably? usually ok   Diagnostic tests none yet   Patient  Stated Goals reduce pain, train for marathon in october   Currently in Pain? Yes   Pain Score 3    Pain Location Buttocks   Pain Orientation Right   Pain Descriptors / Indicators Aching   Pain Onset More than a month ago   Pain Frequency Constant   Aggravating Factors  running, sitting, too much activity   Pain Relieving Factors ice, and not sitting too long            [facility] PT Assessment - 11/15/16 0001      Assessment   Medical Diagnosis R hip pain   Referring Provider Cardell Peach, DO referring but Dr. Darrick Penna now as she has left   Onset Date/Surgical Date 06/27/16   Prior Therapy no     Balance Screen   Has the patient fallen in the past 6 months No     Prior Function   Level of Independence Independent     Observation/Other Assessments   Focus on Therapeutic Outcomes (FOTO)  42%     Functional Tests   Functional tests Squat;Single Leg Squat;Running;Single leg stance     Squat   Comments WNL     Single Leg Squat   Comments WNL     Running  Comments TM jog with mild hip drop bil but not sig; otherwise WNL     Single Leg Stance   Comments WNL     ROM / Strength   AROM / PROM / Strength AROM;Strength;PROM     AROM   Overall AROM Comments lumbar flexion reproduces glute pain with increased neural tension.  eliminated with bent knee;  Other lumbar quadrants cleared, Hip WNL globally     PROM   Overall PROM Comments hip overall WNL     Strength   Overall Strength Comments hip strong and equal except R hip abuction and hip extension at 4-/5     Palpation   Spinal mobility decreased mobility CPA L5 without pain graded 4/6   Palpation comment no ttp over painful glute site     Special Tests    Special Tests Lumbar   Lumbar Tests Slump Test;Straight Leg Raise     Slump test   Findings Positive   Side Right     Straight Leg Raise   Findings Positive   Side  Right   Comment at around 60deg            Objective measurements completed on  examination: See above findings.                  PT Education - 11/15/16 1204    Education provided Yes   Education Details Diagnosis, POC, HEP   Person(s) Educated Patient   Methods Explanation;Demonstration;Handout   Comprehension Verbalized understanding;Returned demonstration;Need further instruction          PT Short Term Goals - 11/15/16 1212      PT SHORT TERM GOAL #1   Title pt will be ind with initial HEP   Time 1   Period Weeks   Status New           PT Long Term Goals - 11/15/16 1212      PT LONG TERM GOAL #1   Title pt will demonstrate a negative slump sit test on the R   Time 6   Period Weeks   Status New     PT LONG TERM GOAL #2   Title pt will return to no sitting limitations due to R glute pain   Time 6   Period Weeks   Status New     PT LONG TERM GOAL #3   Title pt will return to running without increased R glute pain   Time 6   Period Weeks     PT LONG TERM GOAL #4   Title pt will improve R hip MMT to equal the L side   Time 6   Period Weeks                Plan - 11/15/16 1206    Clinical Impression Statement Pt is a 41 year old healthy female who has developed R glute region pain training for a marathon.  She has successfully completed a few half marathons without pain.  Reports no recent shoe or mileage changes but has not had shoes evaluated recently.  Pain is in the glute region but does not feel muscular and stretching the hamstrings as instructed has made it worse.  Findings today include positive neural tension with SLR, slump sit, and forward bending reproducing some the the pain.  Weakness in the R hip extensors and abductors, and slightly decreased spinal mobility at L5.  Pt demonstrates pretty good form with Treadmill running overall.  Pt would  like to do HEP focus if possible having to pay OOP.     Clinical Presentation Stable   Clinical Decision Making Low   Rehab Potential Excellent   PT Frequency 1x / week    PT Duration 6 weeks   PT Treatment/Interventions ADLs/Self Care Home Management;Dry needling;Manual techniques;Therapeutic exercise;Neuromuscular re-education   PT Next Visit Plan assess neural tension status, review neural glides, start adding hip extension/abduction/core strengthening   PT Home Exercise Plan pt was told to stop stationary hamstring stretches and focus on the given HEP x 1 week   Consulted and Agree with Plan of Care Patient      Patient will benefit from skilled therapeutic intervention in order to improve the following deficits and impairments:  Pain, Decreased strength, Abnormal gait  Visit Diagnosis: Pain in right hip - Plan: PT plan of care cert/re-cert  Weakness of right hip - Plan: PT plan of care cert/re-cert     Problem List Patient Active Problem List   Diagnosis Date Noted  . Right hip pain 09/11/2016  . Low back pain 09/11/2016  . Knee pain, bilateral 03/01/2011  . SI (sacroiliac) joint dysfunction 03/01/2011  . Foot pain 03/01/2011  . NSVD (normal spontaneous vaginal delivery) 02/03/2011  . Scapular dysfunction 10/24/2010  . KNEE PAIN, RIGHT 09/14/2009  . Other disorder of muscle, ligament, and fascia 02/25/2007    Violetta Lavalle, Julieanne MansonKara R, DPT, CMP 11/15/2016, 12:17 PM  [facility] [address] [place], [place], [address] Phone: [phone]   Fax:  [phone]  Name: Kathryn Bennett MRN: [id number] Date of Birth: 11/11/1975

## 2016-11-28 ENCOUNTER — Ambulatory Visit: Payer: 59 | Attending: Sports Medicine | Admitting: Physical Therapy

## 2016-11-28 ENCOUNTER — Encounter: Payer: Self-pay | Admitting: Physical Therapy

## 2016-11-28 DIAGNOSIS — R29898 Other symptoms and signs involving the musculoskeletal system: Secondary | ICD-10-CM | POA: Insufficient documentation

## 2016-11-28 DIAGNOSIS — M25551 Pain in right hip: Secondary | ICD-10-CM | POA: Insufficient documentation

## 2016-11-28 NOTE — Patient Instructions (Addendum)
Trigger Point Dry Needling  . What is Trigger Point Dry Needling (DN)? o DN is a physical therapy technique used to treat muscle pain and dysfunction. Specifically, DN helps deactivate muscle trigger points (muscle knots).  o A thin filiform needle is used to penetrate the skin and stimulate the underlying trigger point. The goal is for a local twitch response (LTR) to occur and for the trigger point to relax. No medication of any kind is injected during the procedure.   . What Does Trigger Point Dry Needling Feel Like?  o The procedure feels different for each individual patient. Some patients report that they do not actually feel the needle enter the skin and overall the process is not painful. Very mild bleeding may occur. However, many patients feel a deep cramping in the muscle in which the needle was inserted. This is the local twitch response.   Marland Kitchen. How Will I feel after the treatment? o Soreness is normal, and the onset of soreness may not occur for a few hours. Typically this soreness does not last longer than two days.  o Bruising is uncommon, however; ice can be used to decrease any possible bruising.  o In rare cases feeling tired or nauseous after the treatment is normal. In addition, your symptoms may get worse before they get better, this period will typically not last longer than 24 hours.   . What Can I do After My Treatment? o Increase your hydration by drinking more water for the next 24 hours. o You may place ice or heat on the areas treated that have become sore, however, do not use heat on inflamed or bruised areas. Heat often brings more relief post needling. o You can continue your regular activities, but vigorous activity is not recommended initially after the treatment for 24 hours. o DN is best combined with other physical therapy such as strengthening, stretching, and other therapies.    [facility] [address], [address] [place], [place]  [address] Phone # [phone] Fax [phone] Lower abdominal/core stability exercises  1. Practice your breathing technique: Inhale through your nose expanding your belly and rib cage. Try not to breathe into your chest. Exhale slowly and gradually out your mouth feeling a sense of softness to your body. Practice multiple times. This can be performed unlimited.  2. Finding the lower abdominals. Laying on your back with the knees bent, place your fingers just below your belly button. Using your breathing technique from above, on your exhale gently pull the belly button away from your fingertips without tensing any other muscles. Practice this 5x. Next, as you exhale, draw belly button inwards and hold onto it...then feel as if you are pulling that muscle across your pelvis like you are tightening a belt. This can be hard to do at first so be patient and practice. Do 5-10 reps 1-3 x day. Always recognize quality over quantity; if your abdominal muscles become tired you will notice you may tighten/contract other muscles. This is the time to take a break.   Practice this first laying on your back, then in sitting, progressing to standing and finally adding it to all your daily movements.   3. Finding your pelvic floor. Using the breathing technique above, when your exhale, this time draw your pelvic floor muscles up as if you were attempting to stop the flow of urination. Be careful NOT to tense any other muscles. This can be hard, BE PATIENT. Try to hold up to 10 seconds  repeating 10x. Try 2x a day. Once you feel you are doing this well, add this contraction to exercise #2. First contracting your pelvic floor followed by lower abdominals.   4. Adding leg movements. Add the following leg movements to challenge your ability to keep your core stable:  1. Single leg drop outs: Laying on your back with knees bent feet flat. Inhale,  dropping one knee outward KEEPING YOUR PELVIS STILL. Exhale as you bring the  leg back, simultaneously performing your lower abdominal contraction. Do 5-10 on each leg.   2. Marching: While keeping your pelvis still, lift the right foot a few inches, put it down then lift left foot. This will mimic a march. Start slow to establish control. Once you have control you may speed it up. Do 10-20x. You MUST keep your lower abdominlas contracted while you march. Breathe naturally    3. Single leg slides: Inhale while you slowly slide one leg out keeping your pelvis still. Only slide your leg as far as you can keep your pelvis still. Exhale as you bring the leg back to the start, contracting the lower abdominals as you do that. Keep your upper body relaxed. Do 5-10 on each side.

## 2016-11-28 NOTE — Therapy (Addendum)
[facility] [address], [place], [place], [address] Phone: [phone]   Fax:  [phone]  Physical Therapy Treatment  Patient Details  Name: Kathryn Bennett MRN: [id number] Date of Birth: 10-18-1975 Referring Provider: Melton Krebs, DO referring but Dr. Oneida Alar now as she has left  Encounter Date: 11/28/2016      PT End of Session - 11/28/16 1335    Visit Number 2   Number of Visits 6   Date for PT Re-Evaluation 12/27/16   Authorization Type [employer]   PT Start Time 1230   PT Stop Time 1320   PT Time Calculation (min) 50 min   Activity Tolerance Patient tolerated treatment well   Behavior During Therapy [facility] for tasks assessed/performed      Past Medical History:  Diagnosis Date  . Abnormal Pap smear     Past Surgical History:  Procedure Laterality Date  . extraction of wisdom teeth     age 41  . LEEP  2011  . TONSILLECTOMY      There were no vitals filed for this visit.      Subjective Assessment - 11/28/16 1236    Subjective When run feels tightness in the right hip. I have been doing the nerve flossing with no changes. Increased pain with sitting.    Limitations Sitting   How long can you sit comfortably? 15 minutes   How long can you stand comfortably? pain upon sit to stand   How long can you walk comfortably? usually ok   Diagnostic tests none yet   Patient Stated Goals reduce pain, train for marathon in october   Currently in Pain? Yes   Pain Score 2    Pain Location Buttocks   Pain Orientation Right;Lateral   Pain Descriptors / Indicators Burning   Pain Type Acute pain   Pain Onset More than a month ago   Pain Frequency Constant   Aggravating Factors  running, sitting, sit to stand   Pain Relieving Factors ice, not sitting for long period   Multiple Pain Sites No            [facility] PT Assessment - 11/28/16 0001      Restrictions   Weight Bearing Restrictions No     Cognition    Overall Cognitive Status Within Functional Limits for tasks assessed     Palpation   SI assessment  right ilium is anteriorly rotated                     [facility] Adult PT Treatment/Exercise - 11/28/16 0001      Lumbar Exercises: Aerobic   Stationary Bike 5 min level 3     Manual Therapy   Manual Therapy Soft tissue mobilization;Joint mobilization;Muscle Energy Technique   Joint Mobilization side glide of L3-L5; sacral mobilization to correct right rotation   Soft tissue mobilization to right hamstring, bil. lumbar paraspinals, bil. quadratus, right hamsting, right iliotibial band;    Muscle Energy Technique to correct right ilium          Trigger Point Dry Needling - 11/28/16 1327    Consent Given? Yes   Education Handout Provided Yes   Muscles Treated Upper Body Quadratus Lumborum;Gluteus minimus;Hamstring  right L3-L5 multifidi   Muscles Treated Lower Body Gluteus minimus;Hamstring   Gluteus Minimus Response Twitch response elicited;Palpable increased muscle length   Hamstring Response Twitch response elicited;Palpable increased muscle length  PT Education - 11/28/16 1334    Education provided No   Education Details dry needling; abdominal exercises   Person(s) Educated Patient   Methods Explanation;Demonstration;Verbal cues;Handout   Comprehension Verbalized understanding;Returned demonstration          PT Short Term Goals - 11/28/16 1338      PT SHORT TERM GOAL #1   Title pt will be ind with initial HEP   Time 1   Period Weeks   Status Achieved           PT Long Term Goals - 11/15/16 1212      PT LONG TERM GOAL #1   Title pt will demonstrate a negative slump sit test on the R   Time 6   Period Weeks   Status New     PT LONG TERM GOAL #2   Title pt will return to no sitting limitations due to R glute pain   Time 6   Period Weeks   Status New     PT LONG TERM GOAL #3   Title pt will return to running without  increased R glute pain   Time 6   Period Weeks     PT LONG TERM GOAL #4   Title pt will improve R hip MMT to equal the L side   Time 6   Period Weeks               Plan - 11/28/16 1335    Clinical Impression Statement After therapy, patient pelvis in correct alignment and just had an ache.  Patient right hip abduction went from 3+/5 to 5/5.  Patient had increased mobility of right hamstring and quadratus after treatment.  Patient needs verbal cues to correctly contract her abdominals. Patient will benefit from skilled therapy to reduce pain, improve abdominal strength, improve core strength  to run without pain.    Rehab Potential Excellent   PT Frequency 1x / week   PT Duration 6 weeks   PT Treatment/Interventions ADLs/Self Care Home Management;Dry needling;Manual techniques;Therapeutic exercise;Neuromuscular re-education   PT Next Visit Plan  start adding hip extension/abduction/core strengthening; dry needling to right lumbar, gluteus medius, hamstring; work on abdominals   PT Home Exercise Plan progress to HEP   Consulted and Agree with Plan of Care Patient      Patient will benefit from skilled therapeutic intervention in order to improve the following deficits and impairments:  Pain, Decreased strength, Abnormal gait  Visit Diagnosis: Pain in right hip  Weakness of right hip     Problem List Patient Active Problem List   Diagnosis Date Noted  . Right hip pain 09/11/2016  . Low back pain 09/11/2016  . Knee pain, bilateral 03/01/2011  . SI (sacroiliac) joint dysfunction 03/01/2011  . Foot pain 03/01/2011  . NSVD (normal spontaneous vaginal delivery) 02/03/2011  . Scapular dysfunction 10/24/2010  . KNEE PAIN, RIGHT 09/14/2009  . Other disorder of muscle, ligament, and fascia 02/25/2007    Earlie Counts, PT 11/28/16 1:40 PM   [facility] [address] [place], [place], [address] Phone:  [phone]   Fax:  [phone]  Name: REASE SWINSON MRN: [id number] Date of Birth: 11-03-1975  PHYSICAL THERAPY DISCHARGE SUMMARY  Visits from Start of Care: 2  Current functional level related to goals / functional outcomes: See above.   Remaining deficits: Unable to reassess patient due to not returning since last visit on 11/28/2016.   Education / Equipment: HEP  Plan:                                                    Patient goals were not met. Patient is being discharged due to not returning since the last visit.  Thank you for the referral. Earlie Counts, PT 01/22/17 2:35 PM  ?????

## 2017-02-26 MED FILL — BUPROPION HCL XL 300 MG TAB: 300 | 90 days supply | Qty: 90 | Fill #3

## 2017-05-07 MED FILL — PROMETHAZINE-DM SYRUP: 6.25-15 | 5 days supply | Qty: 100 | Fill #0

## 2017-05-07 MED FILL — BENZONATATE 100 MG CAP: 100 | 10 days supply | Qty: 30 | Fill #0

## 2017-05-07 MED FILL — DOXYCYCLINE HYCLATE 100 MG: 100 | 10 days supply | Qty: 20 | Fill #0

## 2017-06-05 MED FILL — BUPROPION HCL XL 300 MG TAB: 300 | 30 days supply | Qty: 90 | Fill #0

## 2017-07-16 MED FILL — BUPROPION HCL XL 300 MG TAB: 300 | 90 days supply | Qty: 90 | Fill #0

## 2017-08-06 MED FILL — FLUTICASONE PROP 50 MCG SPR: 50 | 60 days supply | Qty: 16 | Fill #0

## 2017-08-20 ENCOUNTER — Ambulatory Visit: Payer: No Typology Code available for payment source | Admitting: Family Medicine

## 2017-08-20 VITALS — BP 114/80 | Ht 63.0 in | Wt 160.0 lb

## 2017-08-20 DIAGNOSIS — S93491A Sprain of other ligament of right ankle, initial encounter: Secondary | ICD-10-CM

## 2017-08-20 NOTE — Progress Notes (Signed)
Chief complaint: Right lateral ankle pain x 2 weeks  History of present illness: Kathryn Bennett is a 42 year old female who presents to sports medicine office today with chief complaint of right lateral ankle pain.  She reports the symptoms been present for approximately 2 weeks now.  She is a dietitian here, reports that she was on a business trip to [place] 2 weeks ago when the injury occurred.  She reports that she was walking down some steps and tripped.  She reports that everything went so fast that she was not sure exactly the mechanism or way her ankle moved, but she noted pain on both the medial and lateral aspect of her right ankle but more so on the lateral aspect.  She reports pain with going up and more so going downstairs.  She also reports pain with right ankle inversion.  She reports of noticing swelling occur the next day. It has decreased over the last few days.  She does not report of any numbness, tingling, or burning paresthesias.  She describes the pain as a throbbing and aching pain.  She does no report of any radiation of pain.  She does not report of any pain with weightbearing.  She reports that she has rested it, used ice, has used Ace compression sleeve, as well as elevating it.  She has been using over-the-counter NSAIDs for pain.  She reports that rest is alleviating factor.  Fortunately, symptoms have not caused her to wake up from sleep at nighttime.  She reports that she has plans to run the half marathon in [place] this weekend and wants to know she is able to run it.  Review of systems:  As stated above  Interval past medical history, surgical history, family history, and social history obtained and unchanged.  Her past medical history is otherwise unremarkable, she is otherwise healthy; surgical history notable for LEEP, wisdom tooth extraction, tonsillectomy; she does not report of any current tobacco use; family history unremarkable for hypertension or type 2 diabetes;  allergies and medications are reviewed and are reflected in EMR.  Physical exam: Vital signs are reviewed and are documented in the chart Gen.: Alert, oriented, appears stated age, in no apparent distress HEENT: Moist oral mucosa Respiratory: Normal respirations, able to speak in full sentences Cardiac: Regular rate, distal pulses 2+ Integumentary: No rashes on visible skin:  Neurologic: Strength 5/5 with right ankle dorsiflexion, plantarflexion, inversion, eversion, sensation 2+ bilateral lower extremities Psych: Normal affect, mood is described as good Musculoskeletal: Inspection of right foot reveals no obvious deformity or muscle atrophy, she does have slight puffiness noted in the anterolateral ankle, otherwise no warmth, erythema, or ecchymosis noted, she is tender palpation over the ATFL, PTFL, and peroneal tendons, no tenderness over the medial or lateral malleolus, no tenderness to palpation over the anterior talus, navicular, Lisfranc, or base of fifth metatarsal, no antalgic gait with ambulation, anterior drawer and talar tilt negative  Assessment and plan: 1.  Right anterolateral ankle sprain  Plan: Discussed with Kathryn Bennett today that it appears that she has suffered a right anterolateral ankle sprain.  I discussed since she does not have any specific tenderness over the medial or lateral malleolus, as well as anterior talus, x-rays are not necessarily warranted today as [place] rules are negative.  Not having any pain with weightbearing is also reassuring.  She does have plans to run in [place] this [place]. I discussed that probably this is not the best  idea for her given current pain. She works out at [employer] a few times a week, discussed not to have her do any type of stairclimbing or any exercises that could put her at risk for ankle inversion or twisting, even with wearing an ASO brace.  Discussed to avoid these activities for the next 2 weeks,  then do trial. Provided her with an ASO brace to wear today.  Discussed using this during activity and while walking.  Discussed ankle range of motion and stretching exercises to do at home.  Discussed to continue with the RICE method.  She can use 1 to 2 tablets of over-the-counter naproxen twice daily as needed for pain over the next 2 weeks.  If she is not improved in the next 2 weeks or has worsening of symptoms, or any other concerns, she is advised to follow-up in the office.  Otherwise, will have her follow-up on as-needed basis.   Haynes Kerns, M.D. Primary Care Sports Medicine Fellow [facility]

## 2017-09-29 ENCOUNTER — Other Ambulatory Visit: Payer: Self-pay | Admitting: Obstetrics and Gynecology

## 2017-09-29 DIAGNOSIS — R928 Other abnormal and inconclusive findings on diagnostic imaging of breast: Secondary | ICD-10-CM

## 2017-09-30 ENCOUNTER — Ambulatory Visit
Admission: RE | Admit: 2017-09-30 | Discharge: 2017-09-30 | Disposition: A | Discharge status: Home / Self Care | Payer: No Typology Code available for payment source | Source: Ambulatory Visit | Attending: Obstetrics and Gynecology | Admitting: Obstetrics and Gynecology

## 2017-09-30 DIAGNOSIS — R928 Other abnormal and inconclusive findings on diagnostic imaging of breast: Secondary | ICD-10-CM

## 2017-12-24 MED FILL — BUPROPION HCL XL 300 MG TAB: 300 | 90 days supply | Qty: 90 | Fill #1

## 2018-02-19 ENCOUNTER — Ambulatory Visit (INDEPENDENT_AMBULATORY_CARE_PROVIDER_SITE_OTHER): Payer: Self-pay | Admitting: Nurse Practitioner

## 2018-02-19 VITALS — BP 110/75 | HR 98 | Temp 97.7°F | Resp 16 | Wt 163.6 lb

## 2018-02-19 DIAGNOSIS — J0110 Acute frontal sinusitis, unspecified: Secondary | ICD-10-CM

## 2018-02-19 MED ORDER — FLUTICASONE PROPIONATE 50 MCG/ACT NA SUSP: 2.0000 | Freq: Every day | NASAL | 0 refills | Status: AC

## 2018-02-19 MED ORDER — BENZONATATE 200 MG PO CAPS: 200.0000 mg | ORAL_CAPSULE | Freq: Three times a day (TID) | ORAL | 0 refills | Status: AC | PRN

## 2018-02-19 MED ORDER — DOXYCYCLINE HYCLATE 100 MG PO TABS
100.0000 mg | ORAL_TABLET | Freq: Two times a day (BID) | ORAL | 0 refills | Status: AC
Start: 2018-02-19 — End: 2018-03-01

## 2018-02-19 MED FILL — DOXYCYCLINE HYCLATE 100 MG: 100 | 10 days supply | Qty: 20 | Fill #0

## 2018-02-19 MED FILL — FLUTICASONE PROP 50 MCG SPR: 50 | 30 days supply | Qty: 16 | Fill #0

## 2018-02-19 MED FILL — BENZONATATE 200 MG CAPS: 200 | 10 days supply | Qty: 30 | Fill #0

## 2018-02-19 NOTE — Patient Instructions (Signed)
Sinusitis, Adult -Take medications as prescribed. -Ibuprofen or Tylenol for pain, fever, or general discomfort. -Warm salt water gargles for throat pain or discomfort.  This may also help with hoarseness. -May use a teaspoon of honey as needed for throat discomfort, as this may also help with hoarseness. -Increase fluids. -May sleep on 2 pillows at bedtime to help with cough. -Use a humidifier or vaporizer when at home and during sleep. -Follow-up as needed if symptoms do not improve within the next 5 to 7 days.    Sinusitis is soreness and inflammation of your sinuses. Sinuses are hollow spaces in the bones around your face. Your sinuses are located:  Around your eyes.  In the middle of your forehead.  Behind your nose.  In your cheekbones.  Your sinuses and nasal passages are lined with a stringy fluid (mucus). Mucus normally drains out of your sinuses. When your nasal tissues become inflamed or swollen, the mucus can become trapped or blocked so air cannot flow through your sinuses. This allows bacteria, viruses, and funguses to grow, which leads to infection. Sinusitis can develop quickly and last for 7?10 days (acute) or for more than 12 weeks (chronic). Sinusitis often develops after a cold. What are the causes? This condition is caused by anything that creates swelling in the sinuses or stops mucus from draining, including:  Allergies.  Asthma.  Bacterial or viral infection.  Abnormally shaped bones between the nasal passages.  Nasal growths that contain mucus (nasal polyps).  Narrow sinus openings.  Pollutants, such as chemicals or irritants in the air.  A foreign object stuck in the nose.  A fungal infection. This is rare.  What increases the risk? The following factors may make you more likely to develop this condition:  Having allergies or asthma.  Having had a recent cold or respiratory tract infection.  Having structural deformities or blockages in  your nose or sinuses.  Having a weak immune system.  Doing a lot of swimming or diving.  Overusing nasal sprays.  Smoking.  What are the signs or symptoms? The main symptoms of this condition are pain and a feeling of pressure around the affected sinuses. Other symptoms include:  Upper toothache.  Earache.  Headache.  Bad breath.  Decreased sense of smell and taste.  A cough that may get worse at night.  Fatigue.  Fever.  Thick drainage from your nose. The drainage is often green and it may contain pus (purulent).  Stuffy nose or congestion.  Postnasal drip. This is when extra mucus collects in the throat or back of the nose.  Swelling and warmth over the affected sinuses.  Sore throat.  Sensitivity to light.  How is this diagnosed? This condition is diagnosed based on symptoms, a medical history, and a physical exam. To find out if your condition is acute or chronic, your health care provider may:  Look in your nose for signs of nasal polyps.  Tap over the affected sinus to check for signs of infection.  View the inside of your sinuses using an imaging device that has a light attached (endoscope).  If your health care provider suspects that you have chronic sinusitis, you may also:  Be tested for allergies.  Have a sample of mucus taken from your nose (nasal culture) and checked for bacteria.  Have a mucus sample examined to see if your sinusitis is related to an allergy.  If your sinusitis does not respond to treatment and it lasts longer than 8  weeks, you may have an MRI or CT scan to check your sinuses. These scans also help to determine how severe your infection is. In rare cases, a bone biopsy may be done to rule out more serious types of fungal sinus disease. How is this treated? Treatment for sinusitis depends on the cause and whether your condition is chronic or acute. If a virus is causing your sinusitis, your symptoms will go away on their own  within 10 days. You may be given medicines to relieve your symptoms, including:  Topical nasal decongestants. They shrink swollen nasal passages and let mucus drain from your sinuses.  Antihistamines. These drugs block inflammation that is triggered by allergies. This can help to ease swelling in your nose and sinuses.  Topical nasal corticosteroids. These are nasal sprays that ease inflammation and swelling in your nose and sinuses.  Nasal saline washes. These rinses can help to get rid of thick mucus in your nose.  If your condition is caused by bacteria, you will be given an antibiotic medicine. If your condition is caused by a fungus, you will be given an antifungal medicine. Surgery may be needed to correct underlying conditions, such as narrow nasal passages. Surgery may also be needed to remove polyps. Follow these instructions at home: Medicines  Take, use, or apply over-the-counter and prescription medicines only as told by your health care provider. These may include nasal sprays.  If you were prescribed an antibiotic medicine, take it as told by your health care provider. Do not stop taking the antibiotic even if you start to feel better. Hydrate and Humidify  Drink enough water to keep your urine clear or pale yellow. Staying hydrated will help to thin your mucus.  Use a cool mist humidifier to keep the humidity level in your home above 50%.  Inhale steam for 10-15 minutes, 3-4 times a day or as told by your health care provider. You can do this in the bathroom while a hot shower is running.  Limit your exposure to cool or dry air. Rest  Rest as much as possible.  Sleep with your head raised (elevated).  Make sure to get enough sleep each night. General instructions  Apply a warm, moist washcloth to your face 3-4 times a day or as told by your health care provider. This will help with discomfort.  Wash your hands often with soap and water to reduce your exposure to  viruses and other germs. If soap and water are not available, use hand sanitizer.  Do not smoke. Avoid being around people who are smoking (secondhand smoke).  Keep all follow-up visits as told by your health care provider. This is important. Contact a health care provider if:  You have a fever.  Your symptoms get worse.  Your symptoms do not improve within 10 days. Get help right away if:  You have a severe headache.  You have persistent vomiting.  You have pain or swelling around your face or eyes.  You have vision problems.  You develop confusion.  Your neck is stiff.  You have trouble breathing. This information is not intended to replace advice given to you by your health care provider. Make sure you discuss any questions you have with your health care provider. Document Released: 04/15/2005 Document Revised: 12/10/2015 Document Reviewed: 02/08/2015 Elsevier Interactive Patient Education  2018 Else[employer]  Laryngitis Laryngitis is inflammation of your vocal cords. This causes hoarseness, coughing, loss of voice, sore throat, or a dry throat. Your  vocal cords are two bands of muscles that are found in your throat. When you speak, these cords come together and vibrate. These vibrations come out through your mouth as sound. When your vocal cords are inflamed, your voice sounds different. Laryngitis can be temporary (acute) or long-term (chronic). Most cases of acute laryngitis improve with time. Chronic laryngitis is laryngitis that lasts for more than three weeks. What are the causes? Acute laryngitis may be caused by:  A viral infection.  Lots of talking, yelling, or singing. This is also called vocal strain.  Bacterial infections.  Chronic laryngitis may be caused by:  Vocal strain.  Injury to your vocal cords.  Acid reflux (gastroesophageal reflux disease or GERD).  Allergies.  Sinus infection.  Smoking.  Alcohol abuse.  Breathing in chemicals or  dust.  Growths on the vocal cords.  What increases the risk? Risk factors for laryngitis include:  Smoking.  Alcohol abuse.  Having allergies.  What are the signs or symptoms? Symptoms of laryngitis may include:  Low, hoarse voice.  Loss of voice.  Dry cough.  Sore throat.  Stuffy nose.  How is this diagnosed? Laryngitis may be diagnosed by:  Physical exam.  Throat culture.  Blood test.  Laryngoscopy. This procedure allows your health care provider to look at your vocal cords with a mirror or viewing tube.  How is this treated? Treatment for laryngitis depends on what is causing it. Usually, treatment involves resting your voice and using medicines to soothe your throat. However, if your laryngitis is caused by a bacterial infection, you may need to take antibiotic medicine. If your laryngitis is caused by a growth, you may need to have a procedure to remove it. Follow these instructions at home:  Drink enough fluid to keep your urine clear or pale yellow.  Breathe in moist air. Use a humidifier if you live in a dry climate.  Take medicines only as directed by your health care provider.  If you were prescribed an antibiotic medicine, finish it all even if you start to feel better.  Do not smoke cigarettes or electronic cigarettes. If you need help quitting, ask your health care provider.  Talk as little as possible. Also avoid whispering, which can cause vocal strain.  Write instead of talking. Do this until your voice is back to normal. Contact a health care provider if:  You have a fever.  You have increasing pain.  You have difficulty swallowing. Get help right away if:  You cough up blood.  You have trouble breathing. This information is not intended to replace advice given to you by your health care provider. Make sure you discuss any questions you have with your health care provider. Document Released: 04/15/2005 Document Revised: 09/21/2015  Document Reviewed: 09/28/2013 Elsevier Interactive Patient Education  [employer].

## 2018-02-19 NOTE — Progress Notes (Signed)
. Subjective:  Kathryn Bennett is a 42 y.o. female who presents for evaluation of possible sinusitis.  Symptoms include cough described as productive of green sputum, headache described as dull, heaviness, nasal congestion, post nasal drip, sinus pressure and sinus pain.  Onset of symptoms was 3 weeks ago, and has been waxed and waned since that time.  Treatment to date:  ibuprofen and Sudafed.  High risk factors for influenza complications:  none.  The following portions of the patient's history were reviewed and updated as appropriate:  allergies, current medications and past medical history.  Constitutional: positive for fatigue and fevers, negative for anorexia, chills, malaise and sweats Eyes: negative Ears, nose, mouth, throat, and face: positive for hoarseness, nasal congestion, sore throat and postnasal drip, negative for ear drainage and earaches Respiratory: positive for cough and sputum, negative for asthma, chronic bronchitis, pleurisy/chest pain, pneumonia, sputum, stridor and wheezing Cardiovascular: negative Gastrointestinal: negative Neurological: positive for headaches, negative for coordination problems, dizziness, seizures, speech problems, tremors, vertigo and weakness Allergic/Immunologic: negative   Objective:  BP 110/75 (BP Location: Right Arm, Patient Position: Sitting, Cuff Size: Normal)   Pulse 98   Temp 97.7 F (36.5 C) (Oral)   Resp 16   Wt 163 lb 9.6 oz (74.2 kg)   SpO2 98%   BMI 28.98 kg/m  General appearance: alert, cooperative, fatigued and no distress Head: Normocephalic, without obvious abnormality, atraumatic Eyes: conjunctivae/corneas clear. PERRL, EOM's intact. Fundi benign. Ears: normal TM's and external ear canals both ears Nose: no discharge, moderate congestion, turbinates swollen, inflamed, mild maxillary sinus tenderness bilateral, moderate frontal sinus tenderness bilateral Throat: abnormal findings: mild oropharyngeal erythema Lungs: clear  to auscultation bilaterally Heart: regular rate and rhythm, S1, S2 normal, no murmur, click, rub or gallop Abdomen: soft, non-tender; bowel sounds normal; no masses,  no organomegaly Pulses: 2+ and symmetric Skin: Skin color, texture, turgor normal. No rashes or lesions Lymph nodes: cervical and submandibular nodes normal Neurologic: Grossly normal    Assessment:  Acute Frontal Sinusitis and Laryngitis    Plan:  Exam findings, diagnosis etiology and medication use and indications reviewed with patient. Follow- Up and discharge instructions provided. No emergent/urgent issues found on exam. Patient education was provided. Patient verbalized understanding of information provided and agrees with plan of care (POC), all questions answered. The patient is advised to call or return to clinic if condition does not see an improvement in symptoms, or to seek the care of the closest emergency department if condition worsens with the above plan.   1. Acute frontal sinusitis, recurrence not specified  - doxycycline (VIBRA-TABS) 100 MG tablet; Take 1 tablet (100 mg total) by mouth 2 (two) times daily for 10 days.  Dispense: 20 tablet; Refill: 0 - fluticasone (FLONASE) 50 MCG/ACT nasal spray; Place 2 sprays into both nostrils daily for 10 days.  Dispense: 16 g; Refill: 0 - benzonatate (TESSALON) 200 MG capsule; Take 1 capsule (200 mg total) by mouth 3 (three) times daily as needed for up to 10 days for cough.  Dispense: 30 capsule; Refill: 0 -Take medications as prescribed. -Ibuprofen or Tylenol for pain, fever, or general discomfort. -Warm salt water gargles for throat pain or discomfort.  This may also help with hoarseness. -May use a teaspoon of honey as needed for throat discomfort, as this may also help with hoarseness. -Increase fluids. -May sleep on 2 pillows at bedtime to help with cough. -Use a humidifier or vaporizer when at home and during sleep. -Follow-up as needed  if symptoms do not  improve within the next 5 to 7 days.

## 2018-04-16 MED FILL — buPROPion HCL ER (XL) 300 M: 300 | 90 days supply | Qty: 90 | Fill #2

## 2018-05-26 ENCOUNTER — Telehealth: Payer: No Typology Code available for payment source | Admitting: Physician Assistant

## 2018-05-26 DIAGNOSIS — Z20828 Contact with and (suspected) exposure to other viral communicable diseases: Secondary | ICD-10-CM

## 2018-05-26 NOTE — Progress Notes (Signed)
Based on what you shared with me it looks like you have a serious condition that should be evaluated in a face to face office visit.  NOTE: If you entered your credit card information for this eVisit, you will not be charged. You may see a "hold" on your card for the $30 but that hold will drop off and you will not have a charge processed.  If you are having a true medical emergency please call 911.  If you need an urgent face to face visit, [facility] has four urgent care centers for your convenience.  If you need care fast and have a high deductible or no insurance consider:   [email or web address] to reserve your spot online an avoid wait times  [facility] [address], [address] [place], [place] [address] 8 am to 8 pm Monday-Friday 10 am to 4 pm Saturday-Sunday *Across the street from [employer]  [facility]  [address] [place], [address] 8 am to 5 pm Monday-Friday * In the [facility]   The following sites will take your  insurance:  . [facility]  [phone] Get Driving Directions Find a Provider at this Location  [address] , [place] [address] . 10 am to 8 pm Monday-Friday . 12 pm to 8 pm Saturday-Sunday   . [facility]  [phone] Get Driving Directions Find a Provider at this Location  [address], [address] [place], [place] [address] . 8 am to 8 pm Monday-Friday . 9 am to 6 pm Saturday . 11 am to 6 pm Sunday   . [facility] [place]  [phone] Get Driving Directions  [address] [place], [place] [address] . 8 am to 8 pm Monday-Friday . 8 am to 4 pm Saturday-Sunday   Your e-visit answers were reviewed by a board certified advanced clinical practitioner to complete your personal care plan.  Thank you for using e-Visits.  

## 2018-07-01 ENCOUNTER — Ambulatory Visit (INDEPENDENT_AMBULATORY_CARE_PROVIDER_SITE_OTHER): Payer: Self-pay | Admitting: Family Medicine

## 2018-07-01 VITALS — BP 120/70 | HR 122 | Temp 101.9°F | Resp 14 | Wt 172.4 lb

## 2018-07-01 DIAGNOSIS — R6889 Other general symptoms and signs: Secondary | ICD-10-CM

## 2018-07-01 DIAGNOSIS — Z20828 Contact with and (suspected) exposure to other viral communicable diseases: Secondary | ICD-10-CM

## 2018-07-01 DIAGNOSIS — R0981 Nasal congestion: Secondary | ICD-10-CM

## 2018-07-01 MED ORDER — AZELASTINE HCL 0.1 % NA SOLN: 1.0000 | Freq: Two times a day (BID) | NASAL | 0 refills | Status: AC

## 2018-07-01 MED ORDER — OSELTAMIVIR PHOSPHATE 75 MG PO CAPS: 75.0000 mg | ORAL_CAPSULE | Freq: Two times a day (BID) | ORAL | 0 refills | Status: AC

## 2018-07-01 MED FILL — AZELASTINE HCL 137 MCG SPRY: 0.1 | 50 days supply | Qty: 30 | Fill #0

## 2018-07-01 MED FILL — OSELTAMIVIR PHOSPHATE 75 MG: 75 | 5 days supply | Qty: 10 | Fill #0

## 2018-07-01 NOTE — Progress Notes (Signed)
Kathryn Bennett is a 43 y.o. female who presents today with 1 days of abrupt onset fever, chills, and body aches. She has not attempted any treatment up to this point and denies any recent national or international travel. She does work in Teacher, music but report known sick household contacts of spouse and son who both in the last 24 hours were diagnoses with influenza A. She reports that she is generally healthy and denies smoking, or any chronic medications or chronic health conditions. Of note patients daughter had influenza B about 3-4 weeks ago at that time she was the only one in the house affected and everyone else recovered well.    Review of Systems  Constitutional: Positive for chills, fever and malaise/fatigue.  HENT: Negative for congestion, ear discharge, ear pain, sinus pain and sore throat.   Eyes: Negative.   Respiratory: Negative for cough, sputum production and shortness of breath.   Cardiovascular: Negative.  Negative for chest pain.  Gastrointestinal: Negative for abdominal pain, diarrhea, nausea and vomiting.  Genitourinary: Negative for dysuria, frequency, hematuria and urgency.  Musculoskeletal: Positive for myalgias.  Skin: Negative.   Neurological: Negative for headaches.  Endo/Heme/Allergies: Negative.   Psychiatric/Behavioral: Negative.     Kathryn Bennett has a current medication list which includes the following prescription(s): bupropion, cetirizine, vitamin d3, azelastine, fluticasone, and oseltamivir. Also is allergic to amoxicillin.  Kathryn Bennett  has a past medical history of Abnormal Pap smear. Also  has a past surgical history that includes Tonsillectomy; LEEP (2011); and extraction of wisdom teeth.    O: Vitals:   07/01/18 1257  BP: 120/70  Pulse: (!) 122  Resp: 14  Temp: (!) 101.9 F (38.8 C)  SpO2: 97%     Physical Exam Vitals signs reviewed.  Constitutional:      General: She is not in acute distress.    Appearance: She is well-developed. She is not  ill-appearing, toxic-appearing or diaphoretic.  HENT:     Head: Normocephalic.     Right Ear: Hearing, tympanic membrane, ear canal and external ear normal.     Left Ear: Hearing, tympanic membrane, ear canal and external ear normal.     Nose: Rhinorrhea present. No congestion.     Right Sinus: No maxillary sinus tenderness or frontal sinus tenderness.     Left Sinus: No maxillary sinus tenderness or frontal sinus tenderness.     Mouth/Throat:     Pharynx: Uvula midline.     Tonsils: Swelling: 1+ on the right. 1+ on the left.  Neck:     Musculoskeletal: Normal range of motion and neck supple.  Cardiovascular:     Rate and Rhythm: Normal rate and regular rhythm.     Pulses: Normal pulses.     Heart sounds: Normal heart sounds.  Pulmonary:     Effort: Pulmonary effort is normal.     Breath sounds: Normal breath sounds.  Abdominal:     General: Bowel sounds are normal.     Palpations: Abdomen is soft.  Musculoskeletal: Normal range of motion.  Lymphadenopathy:     Head:     Right side of head: No submental or submandibular adenopathy.     Left side of head: No submental or submandibular adenopathy.     Cervical: No cervical adenopathy.  Neurological:     Mental Status: She is alert and oriented to person, place, and time.    A: 1. Flu-like symptoms   2. Exposure to influenza   3. Nasal congestion  P: Overall well appearing, NAD even though patient appears she does not feel well she does not appear toxic. Discussed natural history of the disease and treatment options; including supportive care and antiviral therapy. No evidence of high risk for factors for serious influenza complications observed. Encouraged rest, hydration, and to continue OTC tylenol or ibuprofen as prescribed for fever. 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 Recommended wearing a mask daily especially around other people. Remain out of school/work until afebrile for 24 hours w/o use of antipyretics. Educated on  potential complications of the flu. Advised to follow up in clinic or with PCP if she develops any of these concerning symptoms or if current symptoms persist outside of discussed boundaries. Work note until end of week- patient will return to work next Monday 07/06/2018. Given supportive care options to manage symptoms.   Discussed calling pediatrician for daughter who had influenza B earlier this month given that son and spouse have Influenza A currently and treatment for her was initiated this visit. At this time she reports that daughter is not symptomatic.  1. Flu-like symptoms - oseltamivir (TAMIFLU) 75 MG capsule; Take 1 capsule (75 mg total) by mouth 2 (two) times daily for 5 days.  2. Exposure to influenza - oseltamivir (TAMIFLU) 75 MG capsule; Take 1 capsule (75 mg total) by mouth 2 (two) times daily for 5 days.  3. Nasal congestion - azelastine (ASTELIN) 0.1 % nasal spray; Place 1 spray into both nostrils 2 (two) times daily. Use in each nostril as directed   Discussed with patient exam findings, suspected diagnosis etiology and  reviewed recommended treatment plan and follow up, including complications and indications for urgent medical follow up and evaluation. Medications including use and indications reviewed with patient. Patient provided relevant patient education on diagnosis and/or relevant related condition that were discussed and reviewed with patient at discharge. Patient verbalized understanding of information provided and agrees with plan of care (POC), all questions answered.

## 2018-07-01 NOTE — Patient Instructions (Signed)

## 2018-07-02 ENCOUNTER — Encounter: Payer: Self-pay | Admitting: Family Medicine

## 2018-07-03 ENCOUNTER — Telehealth: Payer: Self-pay

## 2018-07-03 NOTE — Telephone Encounter (Signed)
LM on pt vm tcb regarding how she is feeling since her visit with us. 

## 2018-08-11 MED FILL — buPROPion HCL ER (XL) 300 M: 300 | 90 days supply | Qty: 90 | Fill #0

## 2018-11-03 MED FILL — buPROPion HCL ER (XL) 300 M: 300 | 90 days supply | Qty: 90 | Fill #0

## 2019-01-27 MED FILL — buPROPion HCL ER (XL) 300 M: 300 | 90 days supply | Qty: 90 | Fill #1

## 2019-04-26 MED FILL — BUPROPION HCL ER (XL) 300 M: 300 | 30 days supply | Qty: 30 | Fill #2

## 2019-05-26 MED FILL — BUPROPION HCL XL 300 MG TAB: 300 | 30 days supply | Qty: 30 | Fill #3

## 2019-06-25 MED FILL — BuPROPion HCL ER (XL) 300 M: 300 | 30 days supply | Qty: 30 | Fill #4

## 2019-06-25 MED FILL — AZELAIC ACID 15 % GEL: 15 | 30 days supply | Qty: 50 | Fill #0

## 2019-07-02 MED FILL — TRETINOIN 0.025% CREAM: 0.025 | 14 days supply | Qty: 20 | Fill #0

## 2019-07-26 MED FILL — buPROPion HCL ER (XL) 300 M: 300 | 30 days supply | Qty: 30 | Fill #5

## 2019-08-10 ENCOUNTER — Other Ambulatory Visit (HOSPITAL_COMMUNITY): Payer: Self-pay | Admitting: Family Medicine

## 2019-08-10 MED FILL — FLUTICASONE PROP 50 MCG SPR: 50 | 60 days supply | Qty: 16 | Fill #0

## 2019-08-24 MED FILL — buPROPion HCL ER (XL) 300 M: 300 | 90 days supply | Qty: 90 | Fill #0

## 2019-09-06 ENCOUNTER — Other Ambulatory Visit: Payer: Self-pay | Admitting: Obstetrics and Gynecology

## 2019-09-06 DIAGNOSIS — R928 Other abnormal and inconclusive findings on diagnostic imaging of breast: Secondary | ICD-10-CM

## 2019-09-08 ENCOUNTER — Ambulatory Visit
Admission: RE | Admit: 2019-09-08 | Discharge: 2019-09-08 | Disposition: A | Discharge status: Home / Self Care | Payer: No Typology Code available for payment source | Source: Ambulatory Visit | Attending: Obstetrics and Gynecology | Admitting: Obstetrics and Gynecology

## 2019-09-08 ENCOUNTER — Other Ambulatory Visit: Payer: Self-pay

## 2019-09-08 DIAGNOSIS — R928 Other abnormal and inconclusive findings on diagnostic imaging of breast: Secondary | ICD-10-CM

## 2020-01-08 MED FILL — FLUARIX QUADRIVALENT 0.5 ML: 0.5 | 1 days supply | Qty: 1 | Fill #0

## 2020-01-25 MED FILL — buPROPion HCL ER (XL) 300 M: 300 | 90 days supply | Qty: 90 | Fill #1

## 2020-05-23 MED FILL — buPROPion HCL ER (XL) 300 M: 300 | 90 days supply | Qty: 90 | Fill #2

## 2020-06-29 ENCOUNTER — Other Ambulatory Visit (HOSPITAL_COMMUNITY): Payer: Self-pay | Admitting: Dermatology

## 2020-07-07 MED FILL — TRETINOIN 0.025% CREAM: 0.025 | 30 days supply | Qty: 20 | Fill #0

## 2020-07-21 ENCOUNTER — Other Ambulatory Visit (HOSPITAL_BASED_OUTPATIENT_CLINIC_OR_DEPARTMENT_OTHER): Payer: Self-pay

## 2020-08-16 ENCOUNTER — Other Ambulatory Visit (HOSPITAL_COMMUNITY): Payer: Self-pay

## 2020-08-16 MED ORDER — BUPROPION HCL ER (XL) 300 MG PO TB24
300.0000 mg | ORAL_TABLET | Freq: Every day | ORAL | 3 refills | Status: DC
  Filled 2020-08-16: qty 90, 90d supply, fill #0
  Filled 2021-01-04: qty 90, 90d supply, fill #1
  Filled 2021-04-09: qty 90, 90d supply, fill #2
  Filled 2021-07-14: qty 90, 90d supply, fill #3

## 2020-08-17 ENCOUNTER — Other Ambulatory Visit (HOSPITAL_COMMUNITY): Payer: Self-pay

## 2020-08-17 MED ORDER — FLUTICASONE PROPIONATE 50 MCG/ACT NA SUSP
NASAL | 3 refills | Status: DC
  Filled 2020-08-17: qty 16, 60d supply, fill #0

## 2020-08-18 ENCOUNTER — Other Ambulatory Visit (HOSPITAL_COMMUNITY): Payer: Self-pay

## 2020-08-22 ENCOUNTER — Other Ambulatory Visit (HOSPITAL_COMMUNITY): Payer: Self-pay

## 2021-01-04 ENCOUNTER — Other Ambulatory Visit (HOSPITAL_COMMUNITY): Payer: Self-pay

## 2021-01-31 IMAGING — US US BREAST*L* LIMITED INC AXILLA
1 series · 6 of 6 positions shown · non-contrast
Comparison: 09/02/2019 and earlier

CLINICAL DATA: Patient returns after screening study for evaluation
of possible LEFT breast asymmetry.

EXAM:
DIGITAL DIAGNOSTIC LEFT MAMMOGRAM WITH CAD AND TOMO
ULTRASOUND LEFT BREAST

[Series 1: us breast*left* limited inc axilla · 0.06mm/px · 6 of 6 slices shown]
[im 1/6]
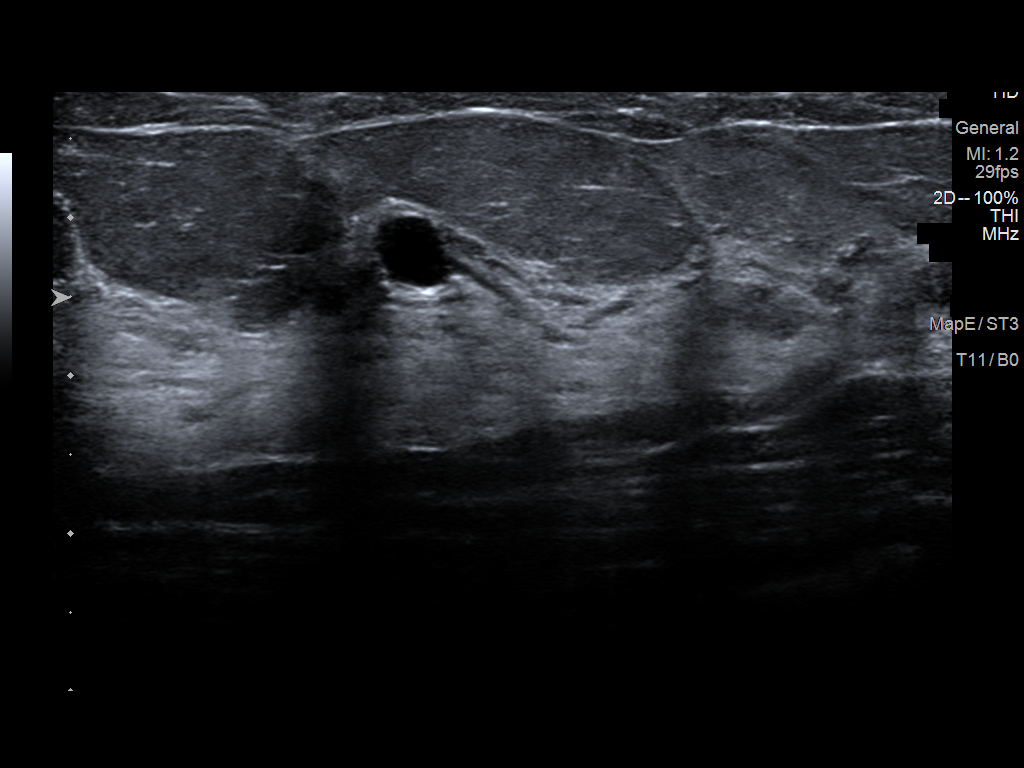
[im 2/6]
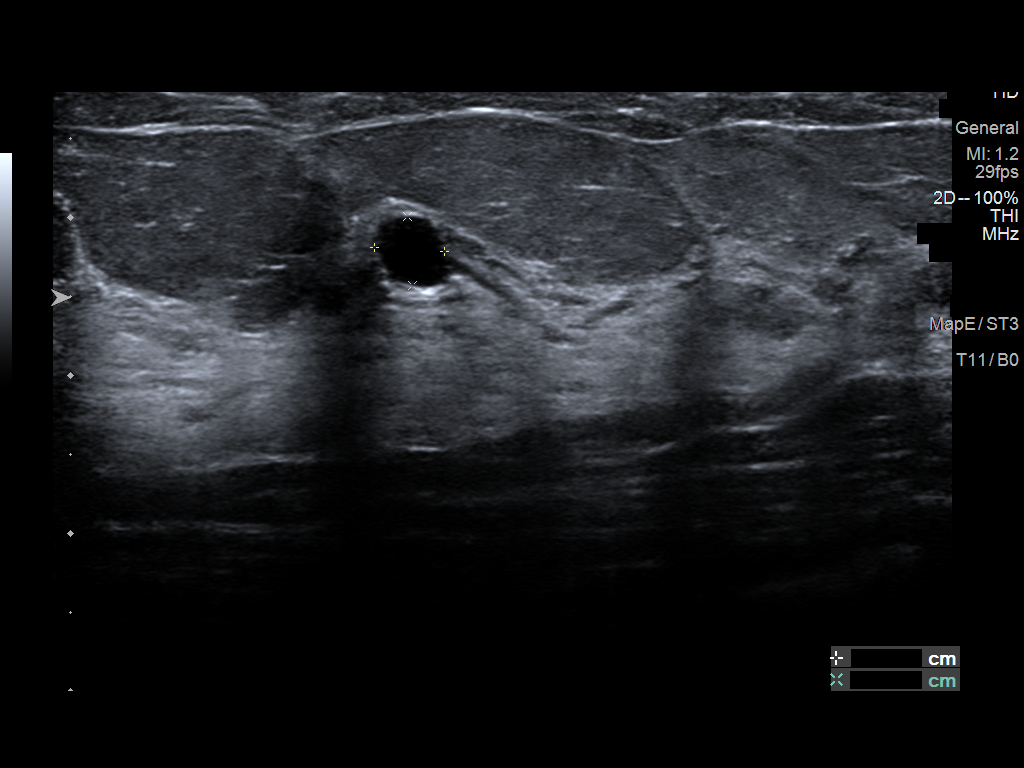
[im 3/6]
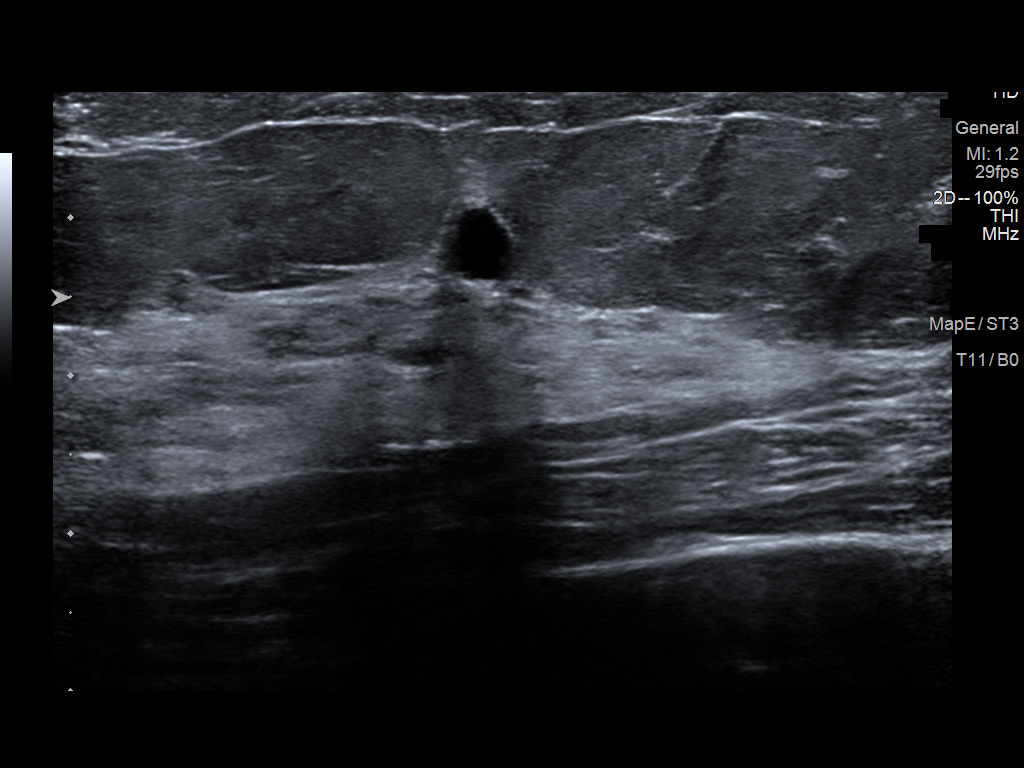
[im 4/6]
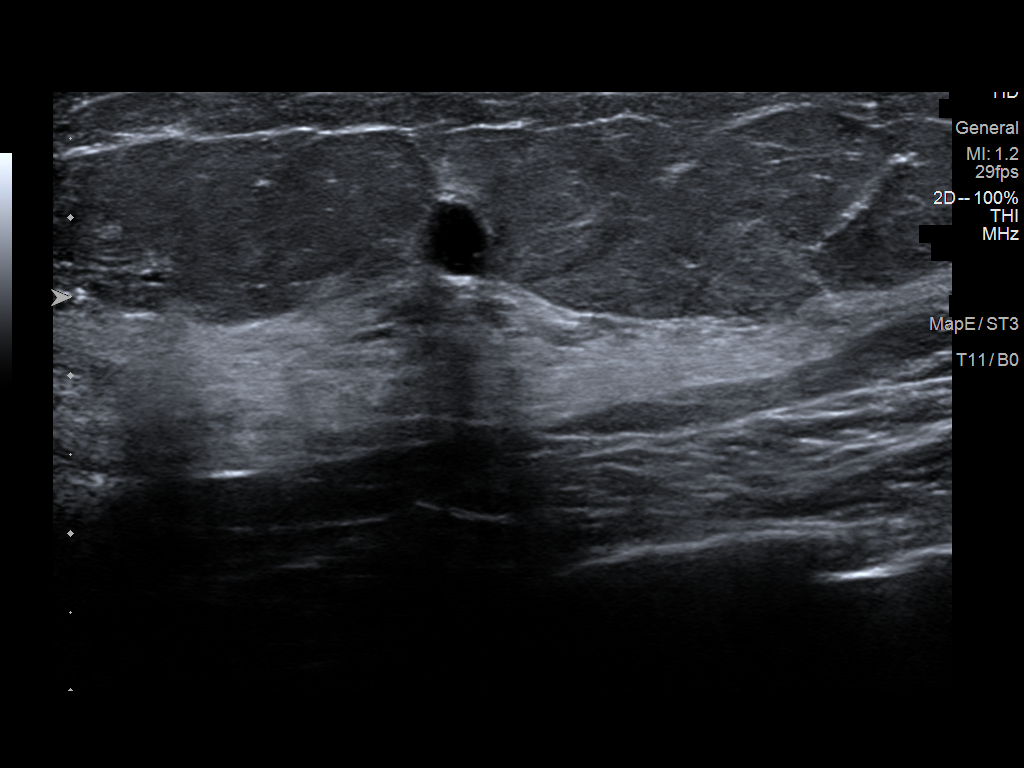
[im 5/6]
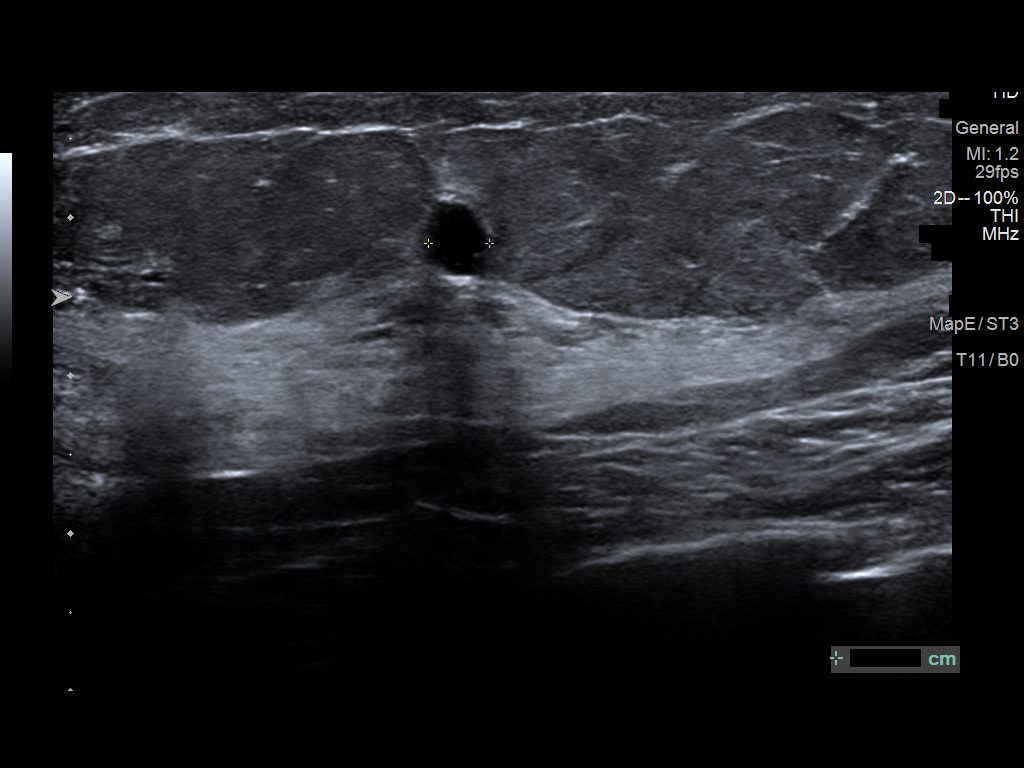
[im 6/6]
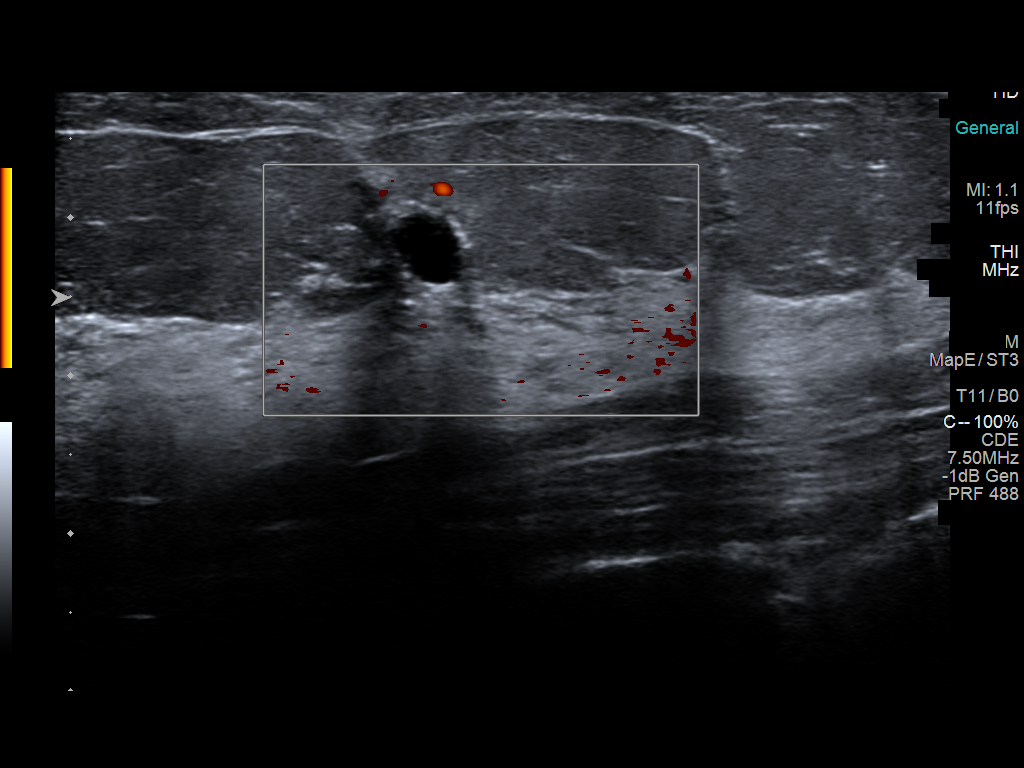

[6 of 6 positions shown; findings below may reference images not displayed]

ACR Breast Density Category c: The breast tissue is heterogeneously
dense, which may obscure small masses.
FINDINGS: Additional 2-D and 3-D images are performed. These views confirm
presence of an oval circumscribed mass in the LATERAL portion of the
LEFT breast.

Mammographic images were processed with CAD.

Targeted ultrasound is performed, showing a simple cyst in the 3
o'clock location of the LEFT breast 3 centimeters from the nipple
measuring 0.4 x 0.4 x 0.4 centimeters. No solid component or areas
of acoustic shadowing.
IMPRESSION: Benign cyst in the LEFT breast accounting for the mammographic
finding. No mammographic or ultrasound evidence for malignancy.

RECOMMENDATION:
Screening mammogram in one year.(Code:AT-V-674)

I have discussed the findings and recommendations with the patient.
If applicable, a reminder letter will be sent to the patient
regarding the next appointment.

BI-RADS CATEGORY  2: Benign.

## 2021-01-31 IMAGING — MG MM DIGITAL DIAGNOSTIC UNILAT*L* W/ TOMO W/ CAD
4 series · 4 of 12 positions shown · non-contrast
Comparison: 09/02/2019 and earlier

CLINICAL DATA: Patient returns after screening study for evaluation
of possible LEFT breast asymmetry.

EXAM:
DIGITAL DIAGNOSTIC LEFT MAMMOGRAM WITH CAD AND TOMO
ULTRASOUND LEFT BREAST

[L ML synth-2D]
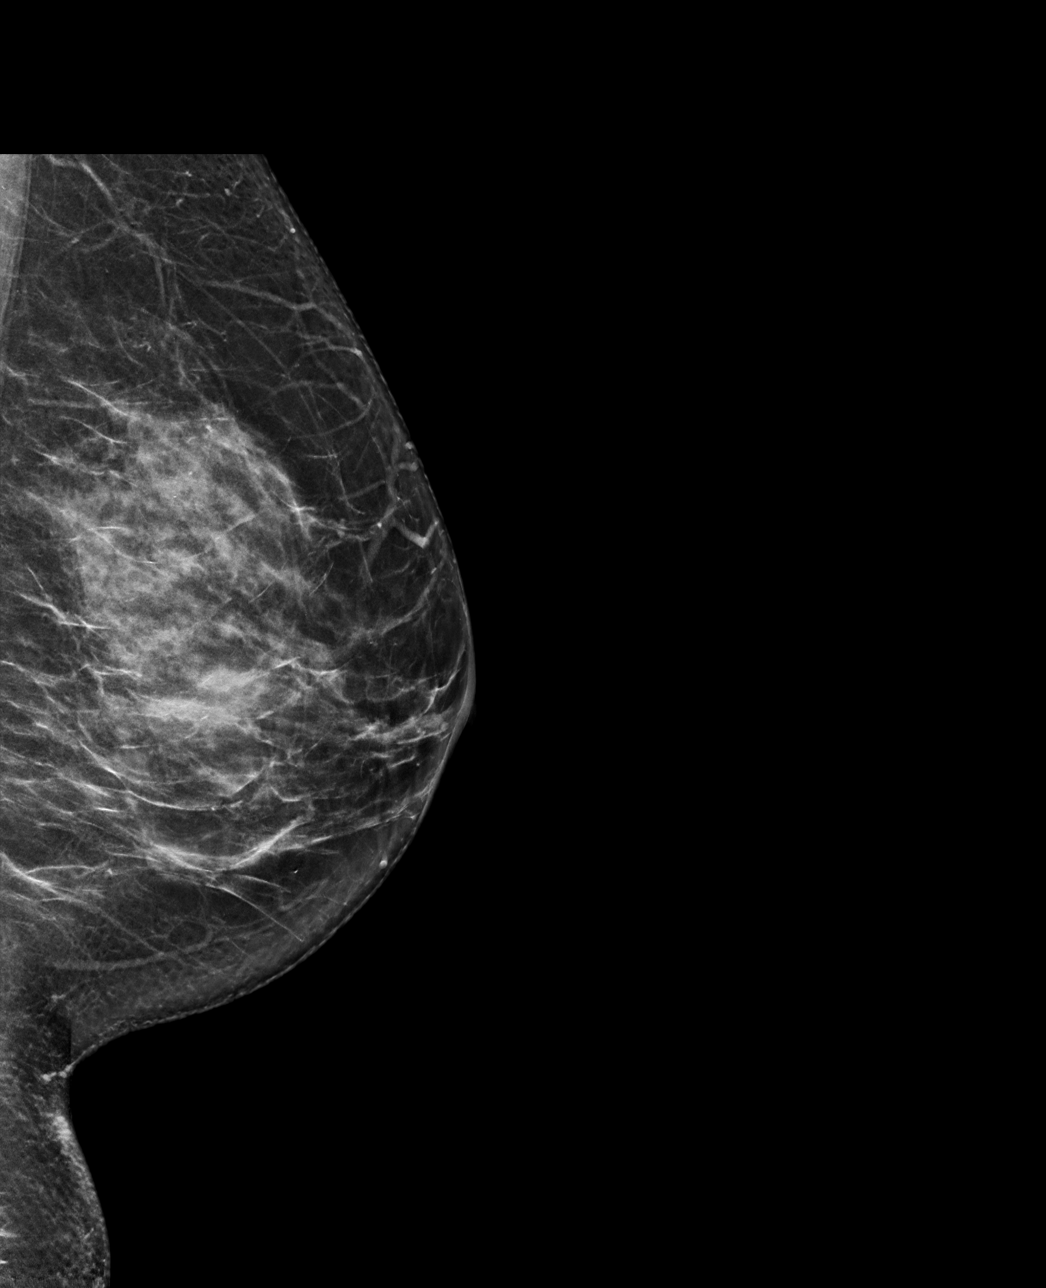

[L CC synth-2D]
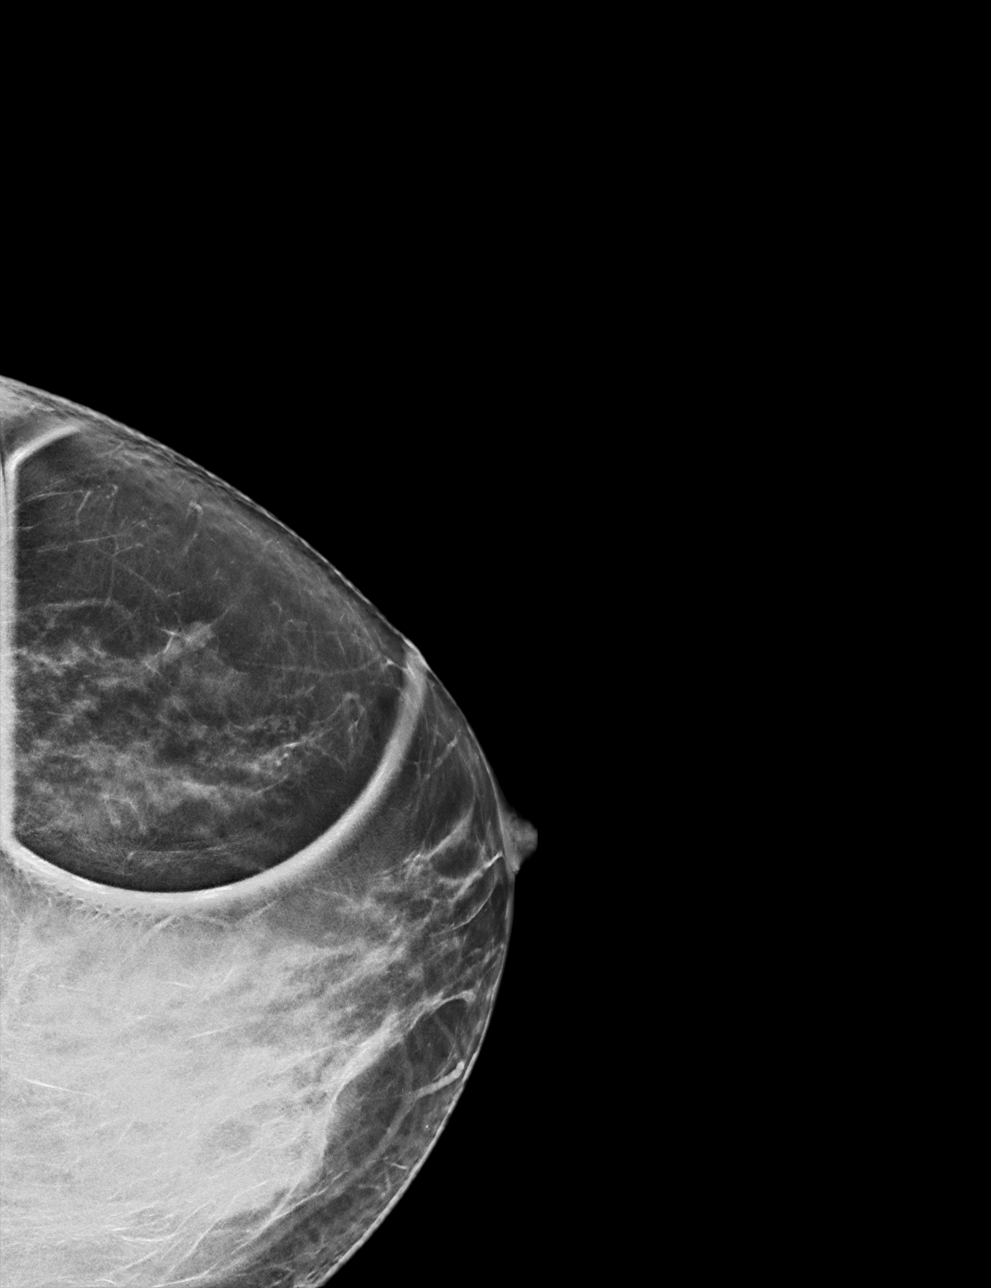

[L ML tomo · tomo slice 39/77.0]
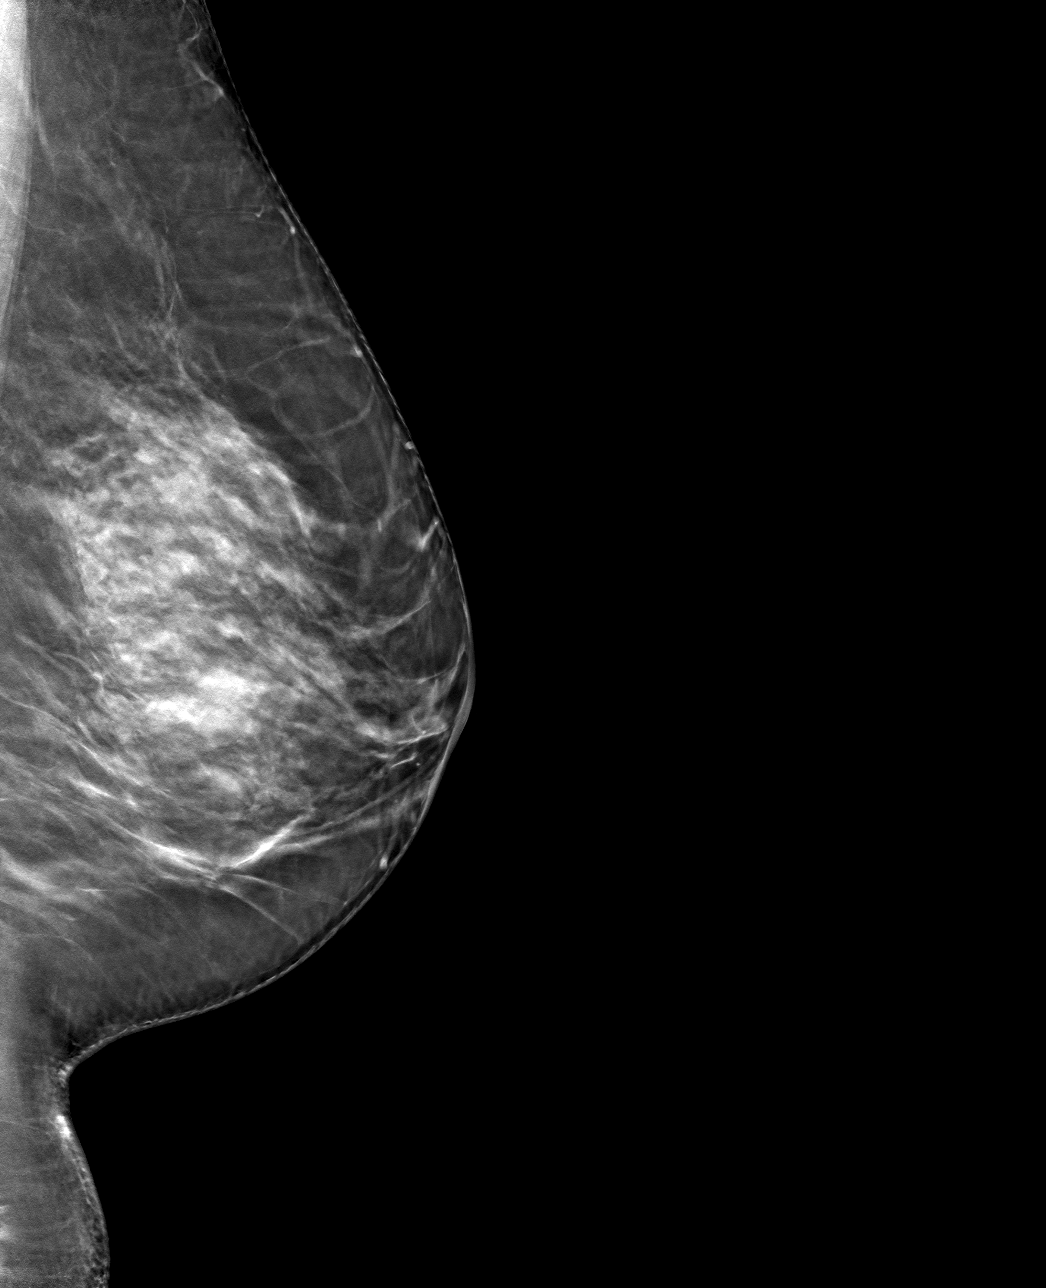

[L CC tomo · tomo slice 33/64.0]
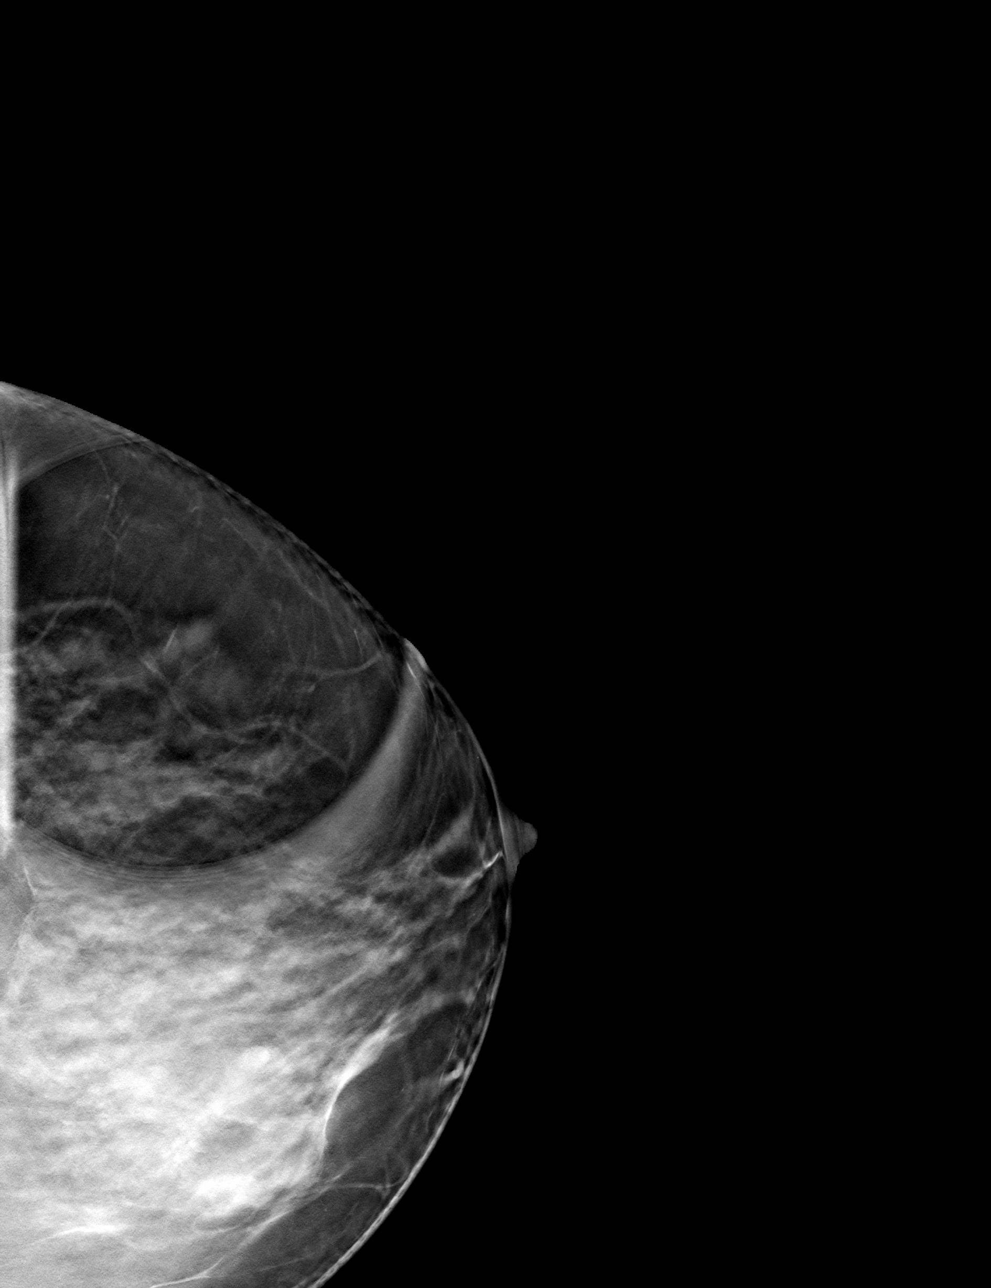

[4 of 12 positions shown; findings below may reference images not displayed]

ACR Breast Density Category c: The breast tissue is heterogeneously
dense, which may obscure small masses.
FINDINGS: Additional 2-D and 3-D images are performed. These views confirm
presence of an oval circumscribed mass in the LATERAL portion of the
LEFT breast.

Mammographic images were processed with CAD.

Targeted ultrasound is performed, showing a simple cyst in the 3
o'clock location of the LEFT breast 3 centimeters from the nipple
measuring 0.4 x 0.4 x 0.4 centimeters. No solid component or areas
of acoustic shadowing.
IMPRESSION: Benign cyst in the LEFT breast accounting for the mammographic
finding. No mammographic or ultrasound evidence for malignancy.

RECOMMENDATION:
Screening mammogram in one year.(Code:AT-V-674)

I have discussed the findings and recommendations with the patient.
If applicable, a reminder letter will be sent to the patient
regarding the next appointment.

BI-RADS CATEGORY  2: Benign.

## 2021-04-09 ENCOUNTER — Other Ambulatory Visit (HOSPITAL_COMMUNITY): Payer: Self-pay

## 2021-04-13 ENCOUNTER — Other Ambulatory Visit (HOSPITAL_COMMUNITY): Payer: Self-pay

## 2021-04-16 ENCOUNTER — Other Ambulatory Visit (HOSPITAL_COMMUNITY): Payer: Self-pay

## 2021-04-17 ENCOUNTER — Other Ambulatory Visit (HOSPITAL_COMMUNITY): Payer: Self-pay

## 2021-04-17 MED ORDER — TRETINOIN 0.025 % EX CREA
TOPICAL_CREAM | CUTANEOUS | 2 refills | Status: DC
  Filled 2021-04-17: qty 20, 30d supply, fill #0
  Filled 2021-07-04 – 2021-07-13 (×2): qty 20, 30d supply, fill #1

## 2021-04-26 ENCOUNTER — Other Ambulatory Visit (HOSPITAL_COMMUNITY): Payer: Self-pay

## 2021-04-26 MED ORDER — PROMETHAZINE-DM 6.25-15 MG/5ML PO SYRP
5.0000 mL | ORAL_SOLUTION | Freq: Every evening | ORAL | 0 refills | Status: DC | PRN
  Filled 2021-04-26: qty 70, 14d supply, fill #0

## 2021-04-26 MED ORDER — BENZONATATE 100 MG PO CAPS
100.0000 mg | ORAL_CAPSULE | Freq: Three times a day (TID) | ORAL | 0 refills | Status: DC | PRN
  Filled 2021-04-26: qty 30, 10d supply, fill #0

## 2021-04-26 MED ORDER — DOXYCYCLINE HYCLATE 100 MG PO TABS
100.0000 mg | ORAL_TABLET | Freq: Two times a day (BID) | ORAL | 0 refills | Status: DC
  Filled 2021-04-26: qty 14, 7d supply, fill #0

## 2021-07-02 ENCOUNTER — Other Ambulatory Visit (HOSPITAL_COMMUNITY): Payer: Self-pay

## 2021-07-02 MED ORDER — TRETINOIN 0.05 % EX CREA
1.0000 "application " | TOPICAL_CREAM | Freq: Every evening | CUTANEOUS | 2 refills | Status: DC
  Filled 2021-07-02: qty 20, 15d supply, fill #0
  Filled 2021-07-20: qty 20, 30d supply, fill #0
  Filled 2022-04-23: qty 20, 30d supply, fill #1
  Filled 2022-05-19 – 2022-05-31 (×2): qty 20, 30d supply, fill #2

## 2021-07-04 ENCOUNTER — Other Ambulatory Visit (HOSPITAL_COMMUNITY): Payer: Self-pay

## 2021-07-12 ENCOUNTER — Other Ambulatory Visit (HOSPITAL_COMMUNITY): Payer: Self-pay

## 2021-07-13 ENCOUNTER — Other Ambulatory Visit (HOSPITAL_COMMUNITY): Payer: Self-pay

## 2021-07-14 ENCOUNTER — Other Ambulatory Visit (HOSPITAL_COMMUNITY): Payer: Self-pay

## 2021-07-16 ENCOUNTER — Other Ambulatory Visit (HOSPITAL_COMMUNITY): Payer: Self-pay

## 2021-07-20 ENCOUNTER — Other Ambulatory Visit (HOSPITAL_COMMUNITY): Payer: Self-pay

## 2021-07-24 ENCOUNTER — Other Ambulatory Visit (HOSPITAL_COMMUNITY): Payer: Self-pay

## 2021-08-22 ENCOUNTER — Other Ambulatory Visit (HOSPITAL_COMMUNITY): Payer: Self-pay

## 2021-08-22 MED ORDER — BUPROPION HCL ER (XL) 300 MG PO TB24
ORAL_TABLET | ORAL | 3 refills | Status: DC
  Filled 2021-08-22 – 2021-10-12 (×2): qty 90, 90d supply, fill #0
  Filled 2022-01-28: qty 90, 90d supply, fill #1
  Filled 2022-04-23: qty 90, 90d supply, fill #2
  Filled 2022-07-29: qty 90, 90d supply, fill #3

## 2021-08-22 MED ORDER — METFORMIN HCL ER 500 MG PO TB24
ORAL_TABLET | ORAL | 1 refills | Status: DC
  Filled 2021-08-22: qty 150, 90d supply, fill #0
  Filled 2021-11-15: qty 150, 75d supply, fill #1

## 2021-10-12 ENCOUNTER — Other Ambulatory Visit (HOSPITAL_COMMUNITY): Payer: Self-pay

## 2021-11-13 ENCOUNTER — Encounter: Payer: Self-pay | Admitting: Gastroenterology

## 2021-11-15 ENCOUNTER — Other Ambulatory Visit (HOSPITAL_COMMUNITY): Payer: Self-pay

## 2021-12-06 ENCOUNTER — Other Ambulatory Visit (HOSPITAL_COMMUNITY): Payer: Self-pay

## 2021-12-06 MED ORDER — METFORMIN HCL ER 750 MG PO TB24
ORAL_TABLET | ORAL | 3 refills | Status: AC
  Filled 2021-12-06: qty 180, 90d supply, fill #0
  Filled 2022-05-31: qty 180, 90d supply, fill #1
  Filled 2022-08-24: qty 180, 90d supply, fill #2
  Filled 2022-11-22: qty 180, 90d supply, fill #3

## 2021-12-17 ENCOUNTER — Ambulatory Visit (AMBULATORY_SURGERY_CENTER): Payer: Self-pay | Admitting: *Deleted

## 2021-12-17 ENCOUNTER — Other Ambulatory Visit (HOSPITAL_COMMUNITY): Payer: Self-pay

## 2021-12-17 VITALS — Ht 63.0 in | Wt 168.0 lb

## 2021-12-17 DIAGNOSIS — Z1211 Encounter for screening for malignant neoplasm of colon: Secondary | ICD-10-CM

## 2021-12-17 MED ORDER — ONDANSETRON HCL 4 MG PO TABS
ORAL_TABLET | ORAL | 0 refills | Status: AC
  Filled 2021-12-17: qty 2, 1d supply, fill #0

## 2021-12-17 MED ORDER — NA SULFATE-K SULFATE-MG SULF 17.5-3.13-1.6 GM/177ML PO SOLN
1.0000 | Freq: Once | ORAL | 0 refills | Status: AC
  Filled 2021-12-17: qty 354, 1d supply, fill #0

## 2021-12-17 NOTE — Progress Notes (Signed)
No egg or soy allergy known to patient  No issues known to pt with past sedation with any surgeries or procedures Patient denies ever being told they had issues or difficulty with intubation  No FH of Malignant Hyperthermia Pt is not on diet pills Pt is not on  home 02  Pt is not on blood thinners  Pt denies issues with constipation  No A fib or A flutter Have any cardiac testing pending--NO Pt instructed to use Single[employer]com or GoodRx for a price reduction on prep    Patient concerned about getting sick from taking prep,zofran 4 mg sent to pharmacy to take 30-60 minutes prior to each prep dose.

## 2021-12-24 ENCOUNTER — Encounter: Payer: Self-pay | Admitting: Gastroenterology

## 2022-01-07 ENCOUNTER — Ambulatory Visit (AMBULATORY_SURGERY_CENTER): Payer: No Typology Code available for payment source | Admitting: Gastroenterology

## 2022-01-07 ENCOUNTER — Encounter: Payer: Self-pay | Admitting: Gastroenterology

## 2022-01-07 VITALS — BP 92/60 | HR 81 | Temp 98.4°F | Resp 12 | Ht 63.0 in | Wt 168.0 lb

## 2022-01-07 DIAGNOSIS — Z1211 Encounter for screening for malignant neoplasm of colon: Secondary | ICD-10-CM | POA: Diagnosis present

## 2022-01-07 MED ORDER — SODIUM CHLORIDE 0.9 % IV SOLN: 500.0000 mL | INTRAVENOUS | Status: DC

## 2022-01-07 NOTE — Patient Instructions (Signed)
Resume previous diet and medications. Repeat Endoscopy in 10 years for surveillance.  YOU HAD AN ENDOSCOPIC PROCEDURE TODAY AT THE [facility]:   Refer to the procedure report that was given to you for any specific questions about what was found during the examination.  If the procedure report does not answer your questions, please call your gastroenterologist to clarify.  If you requested that your care partner not be given the details of your procedure findings, then the procedure report has been included in a sealed envelope for you to review at your convenience later.  YOU SHOULD EXPECT: Some feelings of bloating in the abdomen. Passage of more gas than usual.  Walking can help get rid of the air that was put into your GI tract during the procedure and reduce the bloating. If you had a lower endoscopy (such as a colonoscopy or flexible sigmoidoscopy) you may notice spotting of blood in your stool or on the toilet paper. If you underwent a bowel prep for your procedure, you may not have a normal bowel movement for a few days.  Please Note:  You might notice some irritation and congestion in your nose or some drainage.  This is from the oxygen used during your procedure.  There is no need for concern and it should clear up in a day or so.  SYMPTOMS TO REPORT IMMEDIATELY:  Following lower endoscopy (colonoscopy or flexible sigmoidoscopy):  Excessive amounts of blood in the stool  Significant tenderness or worsening of abdominal pains  Swelling of the abdomen that is new, acute  Fever of 100F or higher  For urgent or emergent issues, a gastroenterologist can be reached at any hour by calling [phone]. Do not use MyChart messaging for urgent concerns.    DIET:  We do recommend a small meal at first, but then you may proceed to your regular diet.  Drink plenty of fluids but you should avoid alcoholic beverages for 24 hours.  ACTIVITY:  You should plan to take it easy for the  rest of today and you should NOT DRIVE or use heavy machinery until tomorrow (because of the sedation medicines used during the test).    FOLLOW UP: Our staff will call the number listed on your records the next business day following your procedure.  We will call around 7:15- 8:00 am to check on you and address any questions or concerns that you may have regarding the information given to you following your procedure. If we do not reach you, we will leave a message.     If any biopsies were taken you will be contacted by phone or by letter within the next 1-3 weeks.  Please call us at [phone] if you have not heard about the biopsies in 3 weeks.    SIGNATURES/CONFIDENTIALITY: You and/or your care partner have signed paperwork which will be entered into your electronic medical record.  These signatures attest to the fact that that the information above on your After Visit Summary has been reviewed and is understood.  Full responsibility of the confidentiality of this discharge information lies with you and/or your care-partner.

## 2022-01-07 NOTE — Progress Notes (Signed)
Patient states no changes in medical/surgical history since pre-visit SChaplin, RN,BSN  

## 2022-01-07 NOTE — Progress Notes (Signed)
   Referring Provider: Gweneth Dimitri, MD Primary Care Physician:  Gweneth Dimitri, MD  Indication for Colonoscopy:  Colon cancer screening   IMPRESSION:  Need for colon cancer screening Appropriate candidate for monitored anesthesia care  PLAN: Colonoscopy in the [facility] today   HPI: Kathryn Bennett is a 46 y.o. female presents for screening colonoscopy.  No prior colonoscopy or colon cancer screening.  No known family history of colon cancer or polyps. No family history of uterine/endometrial cancer, pancreatic cancer or gastric/stomach cancer.   Past Medical History:  Diagnosis Date   Abnormal Pap smear    Allergy    SEASONAL   Anxiety    SITUATIONAL   History of PCOS    TAKE METFORMIN    Past Surgical History:  Procedure Laterality Date   extraction of wisdom teeth     age 48   LEEP  2011   TONSILLECTOMY      Current Outpatient Medications  Medication Sig Dispense Refill   buPROPion (WELLBUTRIN XL) 300 MG 24 hr tablet Take 1 tablet by mouth once a day 90 days 90 tablet 3   Cholecalciferol (VITAMIN D3) 5000 units CAPS 1 capsule  with food     ondansetron (ZOFRAN) 4 MG tablet TAKE 30-60 MINUTES PRIOR TO EACH PREP DOSE 2 tablet 0   tretinoin (RETIN-A) 0.05 % cream Apply a small amount topically to affected area every night. 20 g 2   azelastine (ASTELIN) 0.1 % nasal spray Place 1 spray into both nostrils 2 (two) times daily. Use in each nostril as directed 30 mL 0   buPROPion (WELLBUTRIN XL) 300 MG 24 hr tablet TAKE 1 TABLET BY MOUTH DAILY 90 tablet 3   cetirizine (ZYRTEC ALLERGY) 10 MG tablet as needed.     fluticasone (FLONASE) 50 MCG/ACT nasal spray Place 2 sprays into both nostrils daily for 10 days. 16 g 0   Current Facility-Administered Medications  Medication Dose Route Frequency Provider Last Rate Last Admin   0.9 %  sodium chloride infusion  500 mL Intravenous Continuous Tressia Danas, MD        Allergies as of 01/07/2022 - Review Complete 01/07/2022   Allergen Reaction Noted   Amoxicillin Rash 10/24/2010    Family History  Problem Relation Age of Onset   Colon cancer Neg Hx    Colon polyps Neg Hx    Crohn's disease Neg Hx    Esophageal cancer Neg Hx    Rectal cancer Neg Hx    Stomach cancer Neg Hx    Ulcerative colitis Neg Hx      Physical Exam: General:   Alert,  well-nourished, pleasant and cooperative in NAD Head:  Normocephalic and atraumatic. Eyes:  Sclera clear, no icterus.   Conjunctiva pink. Mouth:  No deformity or lesions.   Neck:  Supple; no masses or thyromegaly. Lungs:  Clear throughout to auscultation.   No wheezes. Heart:  Regular rate and rhythm; no murmurs. Abdomen:  Soft, non-tender, nondistended, normal bowel sounds, no rebound or guarding.  Msk:  Symmetrical. No boney deformities LAD: No inguinal or umbilical LAD Extremities:  No clubbing or edema. Neurologic:  Alert and  oriented x4;  grossly nonfocal Skin:  No obvious rash or bruise. Psych:  Alert and cooperative. Normal mood and affect.     Studies/Results: No results found.    Chinara Hertzberg L. Orvan Falconer, MD, MPH 01/07/2022, 9:20 AM

## 2022-01-07 NOTE — Op Note (Signed)
[facility] Patient Name: Kathryn Bennett Procedure Date: 01/07/2022 9:24 AM MRN: [id number] Endoscopist: Tressia Danas MD, MD Age: 46 Referring MD:  Date of Birth: 1975-08-24 Gender: Female Account #: [id number] Procedure:                Colonoscopy Indications:              Screening for colorectal malignant neoplasm, This                            is the patient's first colonoscopy                           No known family history of colon cancer or polyps Medicines:                Monitored Anesthesia Care Procedure:                Pre-Anesthesia Assessment:                           - Prior to the procedure, a History and Physical                            was performed, and patient medications and                            allergies were reviewed. The patient's tolerance of                            previous anesthesia was also reviewed. The risks                            and benefits of the procedure and the sedation                            options and risks were discussed with the patient.                            All questions were answered, and informed consent                            was obtained. Prior Anticoagulants: The patient has                            taken no previous anticoagulant or antiplatelet                            agents. ASA Grade Assessment: II - A patient with                            mild systemic disease. After reviewing the risks                            and benefits, the patient was deemed in  satisfactory condition to undergo the procedure.                           After obtaining informed consent, the colonoscope                            was passed under direct vision. Throughout the                            procedure, the patient's blood pressure, pulse, and                            oxygen saturations were monitored continuously. The                            CF [id number] #[id number] was  introduced through the anus                            and advanced to the 3 cm into the ileum. A second                            forward view of the right colon was performed. The                            colonoscopy was performed without difficulty. The                            patient tolerated the procedure well. The quality                            of the bowel preparation was excellent. The                            terminal ileum, ileocecal valve, appendiceal                            orifice, and rectum were photographed. Scope In: 9:29:43 AM Scope Out: 9:42:04 AM Scope Withdrawal Time: 0 hours 8 minutes 21 seconds  Total Procedure Duration: 0 hours 12 minutes 21 seconds  Findings:                 Non-bleeding external and internal hemorrhoids were                            found.                           The exam was otherwise without abnormality on                            direct and retroflexion views. Complications:            No immediate complications. Estimated Blood Loss:     Estimated blood loss: none. Impression:               - Non-bleeding  external and internal hemorrhoids.                           - The examination was otherwise normal on direct                            and retroflexion views.                           - No specimens collected. Recommendation:           - Patient has a contact number available for                            emergencies. The signs and symptoms of potential                            delayed complications were discussed with the                            patient. Return to normal activities tomorrow.                            Written discharge instructions were provided to the                            patient.                           - Resume previous diet.                           - Continue present medications.                           - Repeat colonoscopy in 10 years for surveillance,                             earlier with new symptoms.                           - Emerging evidence supports eating a diet of                            fruits, vegetables, grains, calcium, and yogurt                            while reducing red meat and alcohol may reduce the                            risk of colon cancer.                           - Thank you for allowing me to be involved in your  colon cancer prevention. Tressia Danas MD, MD 01/07/2022 9:46:30 AM This report has been signed electronically.

## 2022-01-07 NOTE — Progress Notes (Signed)
To pacu, VSS. Report to [email or web address] 

## 2022-01-08 ENCOUNTER — Telehealth: Payer: Self-pay | Admitting: *Deleted

## 2022-01-08 NOTE — Telephone Encounter (Signed)
  Follow up Call-     01/07/2022    8:36 AM  Call back number  Post procedure Call Back phone  # [phone]  Permission to leave phone message Yes     Patient questions:  Do you have a fever, pain , or abdominal swelling? No. Pain Score  0 *  Have you tolerated food without any problems? Yes.    Have you been able to return to your normal activities? Yes.    Do you have any questions about your discharge instructions: Diet   No. Medications  No. Follow up visit  No.  Do you have questions or concerns about your Care? Yes.    Actions: * If pain score is 4 or above: No action needed, pain <4.

## 2022-01-28 ENCOUNTER — Other Ambulatory Visit (HOSPITAL_COMMUNITY): Payer: Self-pay

## 2022-04-23 ENCOUNTER — Other Ambulatory Visit: Payer: Self-pay

## 2022-05-20 ENCOUNTER — Other Ambulatory Visit (HOSPITAL_COMMUNITY): Payer: Self-pay

## 2022-05-24 ENCOUNTER — Other Ambulatory Visit: Payer: Self-pay

## 2022-05-31 ENCOUNTER — Other Ambulatory Visit (HOSPITAL_COMMUNITY): Payer: Self-pay

## 2022-05-31 ENCOUNTER — Other Ambulatory Visit: Payer: Self-pay

## 2022-06-04 ENCOUNTER — Other Ambulatory Visit (HOSPITAL_COMMUNITY): Payer: Self-pay

## 2022-06-04 DIAGNOSIS — J0100 Acute maxillary sinusitis, unspecified: Secondary | ICD-10-CM | POA: Diagnosis not present

## 2022-06-04 DIAGNOSIS — Z6827 Body mass index (BMI) 27.0-27.9, adult: Secondary | ICD-10-CM | POA: Diagnosis not present

## 2022-06-04 MED ORDER — DOXYCYCLINE MONOHYDRATE 100 MG PO CAPS
100.0000 mg | ORAL_CAPSULE | Freq: Two times a day (BID) | ORAL | 0 refills | Status: AC
  Filled 2022-06-04: qty 20, 10d supply, fill #0

## 2022-07-30 ENCOUNTER — Other Ambulatory Visit (HOSPITAL_COMMUNITY): Payer: Self-pay

## 2022-08-06 DIAGNOSIS — Z01419 Encounter for gynecological examination (general) (routine) without abnormal findings: Secondary | ICD-10-CM | POA: Diagnosis not present

## 2022-08-06 DIAGNOSIS — Z1231 Encounter for screening mammogram for malignant neoplasm of breast: Secondary | ICD-10-CM | POA: Diagnosis not present

## 2022-08-06 DIAGNOSIS — Z6828 Body mass index (BMI) 28.0-28.9, adult: Secondary | ICD-10-CM | POA: Diagnosis not present

## 2022-08-13 DIAGNOSIS — L814 Other melanin hyperpigmentation: Secondary | ICD-10-CM | POA: Diagnosis not present

## 2022-08-13 DIAGNOSIS — D485 Neoplasm of uncertain behavior of skin: Secondary | ICD-10-CM | POA: Diagnosis not present

## 2022-08-13 DIAGNOSIS — D692 Other nonthrombocytopenic purpura: Secondary | ICD-10-CM | POA: Diagnosis not present

## 2022-08-13 DIAGNOSIS — L821 Other seborrheic keratosis: Secondary | ICD-10-CM | POA: Diagnosis not present

## 2022-08-13 DIAGNOSIS — C44519 Basal cell carcinoma of skin of other part of trunk: Secondary | ICD-10-CM | POA: Diagnosis not present

## 2022-08-13 DIAGNOSIS — D2372 Other benign neoplasm of skin of left lower limb, including hip: Secondary | ICD-10-CM | POA: Diagnosis not present

## 2022-08-13 DIAGNOSIS — L718 Other rosacea: Secondary | ICD-10-CM | POA: Diagnosis not present

## 2022-08-24 ENCOUNTER — Other Ambulatory Visit (HOSPITAL_COMMUNITY): Payer: Self-pay

## 2022-09-16 DIAGNOSIS — C44519 Basal cell carcinoma of skin of other part of trunk: Secondary | ICD-10-CM | POA: Diagnosis not present

## 2022-10-21 ENCOUNTER — Other Ambulatory Visit (HOSPITAL_COMMUNITY): Payer: Self-pay

## 2022-10-21 ENCOUNTER — Other Ambulatory Visit: Payer: Self-pay

## 2022-10-21 MED ORDER — BUPROPION HCL ER (XL) 300 MG PO TB24
300.0000 mg | ORAL_TABLET | Freq: Every day | ORAL | 2 refills | Status: DC
Start: 2022-10-21 — End: 2023-07-22
  Filled 2022-10-21: qty 90, 90d supply, fill #0
  Filled 2022-11-22 – 2023-01-15 (×2): qty 90, 90d supply, fill #1
  Filled 2023-04-15: qty 90, 90d supply, fill #2

## 2022-11-22 ENCOUNTER — Other Ambulatory Visit: Payer: Self-pay

## 2023-01-15 ENCOUNTER — Other Ambulatory Visit (HOSPITAL_COMMUNITY): Payer: Self-pay

## 2023-02-11 ENCOUNTER — Other Ambulatory Visit (HOSPITAL_COMMUNITY): Payer: Self-pay

## 2023-02-11 DIAGNOSIS — J329 Chronic sinusitis, unspecified: Secondary | ICD-10-CM | POA: Diagnosis not present

## 2023-02-11 DIAGNOSIS — Z6827 Body mass index (BMI) 27.0-27.9, adult: Secondary | ICD-10-CM | POA: Diagnosis not present

## 2023-02-11 MED ORDER — DOXYCYCLINE HYCLATE 100 MG PO CAPS
100.0000 mg | ORAL_CAPSULE | Freq: Two times a day (BID) | ORAL | 0 refills | Status: AC
  Filled 2023-02-11: qty 20, 10d supply, fill #0

## 2023-02-11 MED ORDER — HYDROCODONE BIT-HOMATROP MBR 5-1.5 MG/5ML PO SOLN
5.0000 mL | Freq: Two times a day (BID) | ORAL | 0 refills | Status: AC
Start: 2023-02-11 — End: ?
  Filled 2023-02-11: qty 70, 7d supply, fill #0

## 2023-02-12 ENCOUNTER — Other Ambulatory Visit (HOSPITAL_COMMUNITY): Payer: Self-pay

## 2023-02-12 ENCOUNTER — Other Ambulatory Visit: Payer: Self-pay

## 2023-02-12 MED ORDER — PREDNISONE 20 MG PO TABS
20.0000 mg | ORAL_TABLET | Freq: Every day | ORAL | 0 refills | Status: AC
  Filled 2023-02-12 (×2): qty 5, 5d supply, fill #0

## 2023-02-13 ENCOUNTER — Other Ambulatory Visit: Payer: Self-pay

## 2023-03-14 DIAGNOSIS — J309 Allergic rhinitis, unspecified: Secondary | ICD-10-CM | POA: Diagnosis not present

## 2023-03-14 DIAGNOSIS — R519 Headache, unspecified: Secondary | ICD-10-CM | POA: Diagnosis not present

## 2023-03-31 ENCOUNTER — Other Ambulatory Visit (HOSPITAL_COMMUNITY): Payer: Self-pay

## 2023-03-31 MED ORDER — AMITRIPTYLINE HCL 10 MG PO TABS
10.0000 mg | ORAL_TABLET | Freq: Every day | ORAL | 0 refills | Status: DC
  Filled 2023-03-31: qty 30, 30d supply, fill #0

## 2023-04-01 ENCOUNTER — Other Ambulatory Visit (HOSPITAL_COMMUNITY): Payer: Self-pay

## 2023-04-15 ENCOUNTER — Other Ambulatory Visit (HOSPITAL_COMMUNITY): Payer: Self-pay

## 2023-04-16 ENCOUNTER — Other Ambulatory Visit: Payer: Self-pay

## 2023-04-17 ENCOUNTER — Other Ambulatory Visit (HOSPITAL_COMMUNITY): Payer: Self-pay

## 2023-04-17 DIAGNOSIS — J309 Allergic rhinitis, unspecified: Secondary | ICD-10-CM | POA: Diagnosis not present

## 2023-04-17 DIAGNOSIS — G44229 Chronic tension-type headache, not intractable: Secondary | ICD-10-CM | POA: Diagnosis not present

## 2023-04-28 ENCOUNTER — Other Ambulatory Visit (HOSPITAL_COMMUNITY): Payer: Self-pay

## 2023-04-29 ENCOUNTER — Other Ambulatory Visit (HOSPITAL_COMMUNITY): Payer: Self-pay

## 2023-04-29 MED ORDER — AMITRIPTYLINE HCL 10 MG PO TABS
10.0000 mg | ORAL_TABLET | Freq: Every day | ORAL | 0 refills | Status: AC
  Filled 2023-04-29: qty 90, 90d supply, fill #0

## 2023-05-08 ENCOUNTER — Other Ambulatory Visit (HOSPITAL_COMMUNITY): Payer: Self-pay

## 2023-05-12 ENCOUNTER — Other Ambulatory Visit (HOSPITAL_COMMUNITY): Payer: Self-pay

## 2023-05-12 MED ORDER — METFORMIN HCL ER 750 MG PO TB24
1500.0000 mg | ORAL_TABLET | Freq: Every day | ORAL | 1 refills | Status: AC
  Filled 2023-05-12: qty 180, 90d supply, fill #0
  Filled 2023-08-18: qty 180, 90d supply, fill #1

## 2023-06-11 ENCOUNTER — Ambulatory Visit (HOSPITAL_COMMUNITY)
Admission: RE | Admit: 2023-06-11 | Discharge: 2023-06-11 | Disposition: A | Discharge status: Home / Self Care | Payer: Commercial Managed Care - PPO | Source: Ambulatory Visit | Attending: Family Medicine | Admitting: Family Medicine

## 2023-06-11 ENCOUNTER — Encounter: Payer: Self-pay | Admitting: Family Medicine

## 2023-06-11 ENCOUNTER — Ambulatory Visit: Payer: Commercial Managed Care - PPO | Admitting: Family Medicine

## 2023-06-11 ENCOUNTER — Other Ambulatory Visit: Payer: Self-pay

## 2023-06-11 VITALS — BP 122/82 | Ht 63.0 in | Wt 155.0 lb

## 2023-06-11 DIAGNOSIS — M25572 Pain in left ankle and joints of left foot: Secondary | ICD-10-CM | POA: Diagnosis not present

## 2023-06-11 DIAGNOSIS — S93402A Sprain of unspecified ligament of left ankle, initial encounter: Secondary | ICD-10-CM | POA: Insufficient documentation

## 2023-06-11 DIAGNOSIS — M79672 Pain in left foot: Secondary | ICD-10-CM | POA: Diagnosis not present

## 2023-06-11 DIAGNOSIS — M7752 Other enthesopathy of left foot: Secondary | ICD-10-CM | POA: Diagnosis not present

## 2023-06-11 NOTE — Assessment & Plan Note (Signed)
Inversion injury 4 days ago with swelling, ecchymosis, pain. Exam significant for edema and resolving ecchymosis surrounding medial and lateral malleoli. TTP over posterior lateral malleolus, proximal 5th metatarsal, ATFL, CFL. On [place] cortical irregularities noted on posterior lateral malleolus and proximal 5th metatarsal c/f avulsion fractures. Given exam and ultrasound findings will place in CAM walker boot and obtain x-rays to evaluate for avulsion fracture.  - follow up x-rays foot and ankle - CAM walker boot - tylenol for pain control

## 2023-06-11 NOTE — Progress Notes (Unsigned)
PCP: Gweneth Dimitri, MD  SUBJECTIVE:   HPI:  Patient is a 48 y.o. female here with chief complaint of left ankle pain. Initially had inversion injury Saturday morning, felt pop, immediately had pain, swelling, and bruising. Rolled it again Saturday night. She has continued to have swelling and is using an ace wrap for compression. Her pain is worse with ambulation and any range of motion. Her pain has been improved with ice and compression therapy. She is not taking any medication for pain. She denies previous ankle injuries. She denies numbness or tingling.   Pertinent ROS were reviewed with the patient and found to be negative unless otherwise specified above in HPI.   PERTINENT  PMH / PSH / FH / SH:  Past Medical, Surgical, Social, and Family History Reviewed & Updated in the EMR.  Pertinent findings include:    Past Medical History:  Diagnosis Date   Abnormal Pap smear    Allergy    SEASONAL   Anxiety    SITUATIONAL   History of PCOS    TAKE METFORMIN    Current Outpatient Medications on File Prior to Visit  Medication Sig Dispense Refill   amitriptyline (ELAVIL) 10 MG tablet Take 1 tablet (10 mg total) by mouth at bedtime. 90 tablet 0   azelastine (ASTELIN) 0.1 % nasal spray Place 1 spray into both nostrils 2 (two) times daily. Use in each nostril as directed 30 mL 0   buPROPion (WELLBUTRIN XL) 300 MG 24 hr tablet TAKE 1 TABLET BY MOUTH DAILY 90 tablet 3   buPROPion (WELLBUTRIN XL) 300 MG 24 hr tablet Take 1 tablet (300 mg total) by mouth daily. 90 tablet 2   cetirizine (ZYRTEC ALLERGY) 10 MG tablet as needed.     Cholecalciferol (VITAMIN D3) 5000 units CAPS 1 capsule  with food     doxycycline (MONODOX) 100 MG capsule Take 1 capsule (100 mg total) by mouth 2 (two) times daily for 10 days 20 capsule 0   doxycycline (VIBRAMYCIN) 100 MG capsule Take 1 capsule (100 mg total) by mouth 2 (two) times daily for 10 days. 20 capsule 0   fluticasone (FLONASE) 50 MCG/ACT nasal spray  Place 2 sprays into both nostrils daily for 10 days. 16 g 0   HYDROcodone bit-homatropine (HYCODAN) 5-1.5 MG/5ML syrup Take 5 mLs by mouth every 12 (twelve) hours as needed for 7 days. 70 mL 0   metFORMIN (GLUCOPHAGE-XR) 750 MG 24 hr tablet Take 2 tablets by mouth with food once daily. (Patient not taking: Reported on 12/17/2021) 180 tablet 3   metFORMIN (GLUCOPHAGE-XR) 750 MG 24 hr tablet Take 2 tablets (1,500 mg total) by mouth daily with food. 180 tablet 1   ondansetron (ZOFRAN) 4 MG tablet TAKE 30-60 MINUTES PRIOR TO EACH PREP DOSE 2 tablet 0   predniSONE (DELTASONE) 20 MG tablet Take 1 tablet (20 mg total) by mouth daily. 5 tablet 0   tretinoin (RETIN-A) 0.05 % cream Apply a small amount topically to affected area every night. 20 g 2   No current facility-administered medications on file prior to visit.    Past Surgical History:  Procedure Laterality Date   extraction of wisdom teeth     age 3   LEEP  2011   TONSILLECTOMY      Allergies  Allergen Reactions   Amoxicillin Rash    OBJECTIVE:  BP 122/82   Ht 5\' 3"  (1.6 m)   Wt 155 lb (70.3 kg)   BMI 27.46 kg/m  PHYSICAL EXAM:  GEN: Alert and Oriented, NAD, comfortable in exam room RESP: Unlabored respirations, symmetric chest rise PSY: normal mood, congruent affect   MSK EXAM: Ankle/Foot, left: Resolving ecchymosis surrounding lateral malleolus and distal medial malleolus. TTP noted at the posterior lateral malleolus, proximal 5th metatarsal, and ATFL, and CFL. No bony deformity. No notable pes planus/cavus deformity. Transverse arch grossly intact; Normal eversion/inversion stress testing. Range of motion is severely limited in all directions 2/2 pain. Strength is 1/5 in all directions 2/2 pain. No tenderness at the insertion/body/myotendinous junction of the Achilles tendon; No peroneal tendon tenderness or subluxation; Stable lateral and medial ligaments; Talar dome nontender; No plantar calcaneal tenderness; No tenderness  over the navicular prominence. No tenderness over cuboid; No tenderness at the distal metatarsals; Antalgic gait walking 4 steps. Normal sensation to light touch. DP pulse 2+.  Provocative Testing:  - Anterior Drawer: NEG  - Talar Tilt: NEG  - Kleiger's Test: NEG  - Tib/Fib Squeeze Test: NEG; Calcaneal Squeeze Test: NEG  Ultrasound Posterior lateral malleolus: cortical irregularity and surrounding soft tissue edema concerning for possible avulsion fracture. 5th metatarsal: soft tissue edema, small cortical irregularity at proximal 5th metatarsal c/f avulsion fracture.  ATFL: visualized proximal and distal insertion with moderate edema. Limited view of mid substance ATFL due to edema. CFL: well visualized no evidence of tears Deltoid: Edema throughout, no evidence of tears  Assessment & Plan Acute left ankle pain  Sprain of left ankle, unspecified ligament, initial encounter  Inversion injury 4 days ago with swelling, ecchymosis, pain. Exam significant for edema and resolving ecchymosis surrounding medial and lateral malleoli. TTP over posterior lateral malleolus, proximal 5th metatarsal, ATFL, CFL. On [place] cortical irregularities noted on posterior lateral malleolus and proximal 5th metatarsal c/f avulsion fractures. Given exam and ultrasound findings will place in CAM walker boot and obtain x-rays to evaluate for avulsion fracture.  - follow up x-rays foot and ankle - CAM walker boot - tylenol for pain control   Micah Noel MD PGY2 [facility]  Fellow attestation  I was present for the physical exam and ultrasound and agree with the above assessment plan.  The patient has been weightbearing although is having significant pain.  Physical exam is concerning for possible fracture with edema and ecchymosis and pain over the fifth metatarsal proximally and posterior malleolus  Ultrasound of the left ankle:   - The anterior deltoid ligament is in tact w/ some mild intra  ligamentous echogenic changes - The proximal and distal attachments of the ATFL have some intra tendonous mild hypoechoic changes and surrounding hypoechoic changes within the soft tissue but no hypoechoic fluid accumulation.  The mid substance is difficult to visualize given the soft tissue edema. - The CFL also has some intra tendonous hypoechoic changes seen in LAX with a calcification separated from the distalmost aspect of the fibular head.  There is some surrounding hypoechoic fluid. - Posterior edge of lateral malleolus visualized w/ possible disruption of the ostium.  - The FB was followed to the insertion at the base of the and is in tact w/ surrounding hypoechoic changes and a possible defect in the ostium - The posterior edge of the medial malleolus is visualized w/out disruption of the ostium.   Summary: Consistent with possible avulsion fracture of the distal fibula, possible avulsion fracture of the proximal fifth metatarsal, and sprain of the CFL and ATFL ligaments.  Strain of the deltoid ligaments as well.  Ultrasound and interpretation by Dr. Webb Silversmith  and Dr. Christella Hartigan  I agree with the above assessment and plan.  Given [place] ankle rules, physical exam findings, and ultrasound we will have her get an x-ray and stand a cam walker boot for support.  Will follow-up with the results of her x-ray.  Rica Mote MD [facility] Fellow

## 2023-06-13 ENCOUNTER — Other Ambulatory Visit (HOSPITAL_COMMUNITY): Payer: Self-pay

## 2023-06-16 ENCOUNTER — Other Ambulatory Visit (HOSPITAL_COMMUNITY): Payer: Self-pay

## 2023-06-16 MED ORDER — TRETINOIN 0.05 % EX CREA
1.0000 | TOPICAL_CREAM | Freq: Every evening | CUTANEOUS | 2 refills | Status: AC
Start: 2023-06-16 — End: ?
  Filled 2023-06-16 – 2023-06-24 (×2): qty 20, 30d supply, fill #0
  Filled 2023-07-21: qty 20, 30d supply, fill #1
  Filled 2023-10-15: qty 20, 30d supply, fill #2

## 2023-06-25 ENCOUNTER — Other Ambulatory Visit: Payer: Self-pay

## 2023-07-08 ENCOUNTER — Other Ambulatory Visit (HOSPITAL_COMMUNITY): Payer: Self-pay

## 2023-07-21 ENCOUNTER — Other Ambulatory Visit (HOSPITAL_COMMUNITY): Payer: Self-pay

## 2023-07-22 ENCOUNTER — Other Ambulatory Visit (HOSPITAL_COMMUNITY): Payer: Self-pay

## 2023-07-22 ENCOUNTER — Other Ambulatory Visit: Payer: Self-pay

## 2023-07-22 MED ORDER — BUPROPION HCL ER (XL) 300 MG PO TB24
300.0000 mg | ORAL_TABLET | Freq: Every day | ORAL | 0 refills | Status: DC
  Filled 2023-07-22: qty 90, 90d supply, fill #0

## 2023-07-25 ENCOUNTER — Other Ambulatory Visit (HOSPITAL_COMMUNITY): Payer: Self-pay

## 2023-08-04 ENCOUNTER — Other Ambulatory Visit: Payer: Self-pay

## 2023-08-04 ENCOUNTER — Other Ambulatory Visit (HOSPITAL_COMMUNITY): Payer: Self-pay

## 2023-08-04 MED ORDER — AMITRIPTYLINE HCL 10 MG PO TABS
10.0000 mg | ORAL_TABLET | Freq: Every day | ORAL | 0 refills | Status: DC
  Filled 2023-08-04: qty 90, 90d supply, fill #0

## 2023-08-19 ENCOUNTER — Other Ambulatory Visit (HOSPITAL_COMMUNITY): Payer: Self-pay

## 2023-08-26 DIAGNOSIS — Z130 Encounter for screening for diseases of the blood and blood-forming organs and certain disorders involving the immune mechanism: Secondary | ICD-10-CM | POA: Diagnosis not present

## 2023-08-26 DIAGNOSIS — Z0000 Encounter for general adult medical examination without abnormal findings: Secondary | ICD-10-CM | POA: Diagnosis not present

## 2023-08-26 DIAGNOSIS — G44229 Chronic tension-type headache, not intractable: Secondary | ICD-10-CM | POA: Diagnosis not present

## 2023-08-26 DIAGNOSIS — E7800 Pure hypercholesterolemia, unspecified: Secondary | ICD-10-CM | POA: Diagnosis not present

## 2023-08-26 DIAGNOSIS — E282 Polycystic ovarian syndrome: Secondary | ICD-10-CM | POA: Diagnosis not present

## 2023-08-26 DIAGNOSIS — J309 Allergic rhinitis, unspecified: Secondary | ICD-10-CM | POA: Diagnosis not present

## 2023-09-08 DIAGNOSIS — D225 Melanocytic nevi of trunk: Secondary | ICD-10-CM | POA: Diagnosis not present

## 2023-09-08 DIAGNOSIS — D2372 Other benign neoplasm of skin of left lower limb, including hip: Secondary | ICD-10-CM | POA: Diagnosis not present

## 2023-09-08 DIAGNOSIS — L649 Androgenic alopecia, unspecified: Secondary | ICD-10-CM | POA: Diagnosis not present

## 2023-09-08 DIAGNOSIS — Z85828 Personal history of other malignant neoplasm of skin: Secondary | ICD-10-CM | POA: Diagnosis not present

## 2023-09-08 DIAGNOSIS — L918 Other hypertrophic disorders of the skin: Secondary | ICD-10-CM | POA: Diagnosis not present

## 2023-09-08 DIAGNOSIS — D1801 Hemangioma of skin and subcutaneous tissue: Secondary | ICD-10-CM | POA: Diagnosis not present

## 2023-09-09 DIAGNOSIS — H52203 Unspecified astigmatism, bilateral: Secondary | ICD-10-CM | POA: Diagnosis not present

## 2023-09-09 DIAGNOSIS — H5213 Myopia, bilateral: Secondary | ICD-10-CM | POA: Diagnosis not present

## 2023-10-14 DIAGNOSIS — Z124 Encounter for screening for malignant neoplasm of cervix: Secondary | ICD-10-CM | POA: Diagnosis not present

## 2023-10-14 DIAGNOSIS — Z6829 Body mass index (BMI) 29.0-29.9, adult: Secondary | ICD-10-CM | POA: Diagnosis not present

## 2023-10-14 DIAGNOSIS — Z1231 Encounter for screening mammogram for malignant neoplasm of breast: Secondary | ICD-10-CM | POA: Diagnosis not present

## 2023-10-14 DIAGNOSIS — Z01419 Encounter for gynecological examination (general) (routine) without abnormal findings: Secondary | ICD-10-CM | POA: Diagnosis not present

## 2023-10-15 ENCOUNTER — Other Ambulatory Visit (HOSPITAL_COMMUNITY): Payer: Self-pay

## 2023-10-16 ENCOUNTER — Other Ambulatory Visit: Payer: Self-pay

## 2023-10-16 ENCOUNTER — Other Ambulatory Visit (HOSPITAL_COMMUNITY): Payer: Self-pay

## 2023-10-16 MED ORDER — BUPROPION HCL ER (XL) 300 MG PO TB24
300.0000 mg | ORAL_TABLET | Freq: Every day | ORAL | 1 refills | Status: DC
  Filled 2023-10-16: qty 90, 90d supply, fill #0
  Filled 2024-01-13: qty 90, 90d supply, fill #1

## 2023-11-12 ENCOUNTER — Other Ambulatory Visit (HOSPITAL_COMMUNITY): Payer: Self-pay

## 2023-11-20 ENCOUNTER — Other Ambulatory Visit (HOSPITAL_COMMUNITY): Payer: Self-pay

## 2023-11-26 ENCOUNTER — Other Ambulatory Visit: Payer: Self-pay

## 2023-11-26 ENCOUNTER — Other Ambulatory Visit (HOSPITAL_COMMUNITY): Payer: Self-pay

## 2023-11-26 MED ORDER — AMITRIPTYLINE HCL 10 MG PO TABS
10.0000 mg | ORAL_TABLET | Freq: Every day | ORAL | 1 refills | Status: DC
  Filled 2023-11-26: qty 90, 90d supply, fill #0
  Filled 2024-02-19: qty 90, 90d supply, fill #1

## 2024-01-13 ENCOUNTER — Other Ambulatory Visit (HOSPITAL_COMMUNITY): Payer: Self-pay

## 2024-01-14 ENCOUNTER — Other Ambulatory Visit: Payer: Self-pay

## 2024-01-14 ENCOUNTER — Other Ambulatory Visit (HOSPITAL_COMMUNITY): Payer: Self-pay

## 2024-01-15 ENCOUNTER — Other Ambulatory Visit (HOSPITAL_COMMUNITY): Payer: Self-pay

## 2024-01-15 ENCOUNTER — Other Ambulatory Visit: Payer: Self-pay

## 2024-01-15 MED ORDER — METFORMIN HCL ER 750 MG PO TB24
1500.0000 mg | ORAL_TABLET | Freq: Every day | ORAL | 0 refills | Status: DC
  Filled 2024-01-15: qty 180, 90d supply, fill #0

## 2024-02-19 ENCOUNTER — Other Ambulatory Visit (HOSPITAL_COMMUNITY): Payer: Self-pay

## 2024-02-19 ENCOUNTER — Other Ambulatory Visit: Payer: Self-pay

## 2024-02-21 ENCOUNTER — Other Ambulatory Visit (HOSPITAL_COMMUNITY): Payer: Self-pay

## 2024-02-25 ENCOUNTER — Other Ambulatory Visit (HOSPITAL_COMMUNITY): Payer: Self-pay

## 2024-02-25 DIAGNOSIS — E559 Vitamin D deficiency, unspecified: Secondary | ICD-10-CM | POA: Diagnosis not present

## 2024-02-25 DIAGNOSIS — E7800 Pure hypercholesterolemia, unspecified: Secondary | ICD-10-CM | POA: Diagnosis not present

## 2024-02-25 DIAGNOSIS — L709 Acne, unspecified: Secondary | ICD-10-CM | POA: Diagnosis not present

## 2024-02-25 DIAGNOSIS — F411 Generalized anxiety disorder: Secondary | ICD-10-CM | POA: Diagnosis not present

## 2024-02-25 DIAGNOSIS — E282 Polycystic ovarian syndrome: Secondary | ICD-10-CM | POA: Diagnosis not present

## 2024-02-25 DIAGNOSIS — G44229 Chronic tension-type headache, not intractable: Secondary | ICD-10-CM | POA: Diagnosis not present

## 2024-02-25 DIAGNOSIS — Z6829 Body mass index (BMI) 29.0-29.9, adult: Secondary | ICD-10-CM | POA: Diagnosis not present

## 2024-02-25 MED ORDER — AMITRIPTYLINE HCL 10 MG PO TABS
10.0000 mg | ORAL_TABLET | Freq: Every day | ORAL | 1 refills | Status: AC
  Filled 2024-02-25 – 2024-05-19 (×2): qty 90, 90d supply, fill #0

## 2024-02-25 MED ORDER — METFORMIN HCL ER 750 MG PO TB24
1500.0000 mg | ORAL_TABLET | Freq: Every day | ORAL | 1 refills | Status: AC
  Filled 2024-02-25 – 2024-04-09 (×5): qty 180, 90d supply, fill #0

## 2024-02-25 MED ORDER — BUPROPION HCL ER (XL) 300 MG PO TB24
300.0000 mg | ORAL_TABLET | Freq: Every morning | ORAL | 1 refills | Status: AC
  Filled 2024-02-25 – 2024-04-09 (×4): qty 90, 90d supply, fill #0

## 2024-02-26 ENCOUNTER — Other Ambulatory Visit (HOSPITAL_COMMUNITY): Payer: Self-pay

## 2024-03-11 ENCOUNTER — Other Ambulatory Visit: Payer: Self-pay

## 2024-03-11 ENCOUNTER — Other Ambulatory Visit (HOSPITAL_COMMUNITY): Payer: Self-pay

## 2024-03-19 ENCOUNTER — Other Ambulatory Visit (HOSPITAL_COMMUNITY): Payer: Self-pay

## 2024-04-07 ENCOUNTER — Other Ambulatory Visit (HOSPITAL_COMMUNITY): Payer: Self-pay

## 2024-04-07 DIAGNOSIS — R0982 Postnasal drip: Secondary | ICD-10-CM | POA: Diagnosis not present

## 2024-04-07 DIAGNOSIS — R0981 Nasal congestion: Secondary | ICD-10-CM | POA: Diagnosis not present

## 2024-04-07 DIAGNOSIS — R509 Fever, unspecified: Secondary | ICD-10-CM | POA: Diagnosis not present

## 2024-04-07 DIAGNOSIS — J101 Influenza due to other identified influenza virus with other respiratory manifestations: Secondary | ICD-10-CM | POA: Diagnosis not present

## 2024-04-07 MED ORDER — OSELTAMIVIR PHOSPHATE 75 MG PO CAPS
75.0000 mg | ORAL_CAPSULE | Freq: Two times a day (BID) | ORAL | 0 refills | Status: AC
  Filled 2024-04-07 (×2): qty 10, 5d supply, fill #0

## 2024-04-09 ENCOUNTER — Other Ambulatory Visit: Payer: Self-pay

## 2024-04-09 ENCOUNTER — Other Ambulatory Visit (HOSPITAL_BASED_OUTPATIENT_CLINIC_OR_DEPARTMENT_OTHER): Payer: Self-pay

## 2024-04-30 ENCOUNTER — Other Ambulatory Visit (HOSPITAL_COMMUNITY): Payer: Self-pay

## 2024-04-30 MED ORDER — CLOBETASOL PROPIONATE 0.05 % EX OINT
1.0000 | TOPICAL_OINTMENT | Freq: Two times a day (BID) | CUTANEOUS | 0 refills | Status: AC
  Filled 2024-04-30: qty 30, 14d supply, fill #0

## 2024-04-30 MED ORDER — FLUCONAZOLE 150 MG PO TABS
150.0000 mg | ORAL_TABLET | ORAL | 0 refills | Status: AC
  Filled 2024-04-30: qty 2, 6d supply, fill #0

## 2024-05-19 ENCOUNTER — Other Ambulatory Visit: Payer: Self-pay

## 2024-05-19 ENCOUNTER — Other Ambulatory Visit (HOSPITAL_BASED_OUTPATIENT_CLINIC_OR_DEPARTMENT_OTHER): Payer: Self-pay
